# Patient Record
Sex: Female | Born: 1950 | Race: White | Hispanic: No | Marital: Single | Smoking: Former smoker
Health system: Southern US, Community
[De-identification: ages and names within clinical notes are randomized; demographics above are authoritative.]

## PROBLEM LIST (undated history)

## (undated) DIAGNOSIS — K635 Polyp of colon: Secondary | ICD-10-CM

## (undated) DIAGNOSIS — I10 Essential (primary) hypertension: Secondary | ICD-10-CM

## (undated) DIAGNOSIS — G4700 Insomnia, unspecified: Secondary | ICD-10-CM

## (undated) DIAGNOSIS — F32A Depression, unspecified: Secondary | ICD-10-CM

## (undated) DIAGNOSIS — F329 Major depressive disorder, single episode, unspecified: Secondary | ICD-10-CM

## (undated) DIAGNOSIS — M5416 Radiculopathy, lumbar region: Secondary | ICD-10-CM

## (undated) DIAGNOSIS — G2581 Restless legs syndrome: Secondary | ICD-10-CM

## (undated) DIAGNOSIS — K2270 Barrett's esophagus without dysplasia: Secondary | ICD-10-CM

## (undated) DIAGNOSIS — N3941 Urge incontinence: Secondary | ICD-10-CM

## (undated) DIAGNOSIS — M1990 Unspecified osteoarthritis, unspecified site: Secondary | ICD-10-CM

## (undated) DIAGNOSIS — K219 Gastro-esophageal reflux disease without esophagitis: Secondary | ICD-10-CM

## (undated) DIAGNOSIS — I340 Nonrheumatic mitral (valve) insufficiency: Secondary | ICD-10-CM

## (undated) DIAGNOSIS — K529 Noninfective gastroenteritis and colitis, unspecified: Secondary | ICD-10-CM

## (undated) DIAGNOSIS — E785 Hyperlipidemia, unspecified: Secondary | ICD-10-CM

## (undated) DIAGNOSIS — E119 Type 2 diabetes mellitus without complications: Secondary | ICD-10-CM

## (undated) DIAGNOSIS — F419 Anxiety disorder, unspecified: Secondary | ICD-10-CM

## (undated) HISTORY — PX: HERNIA REPAIR: SHX51

## (undated) HISTORY — PX: VAGINAL HYSTERECTOMY: SUR661

## (undated) HISTORY — PX: ABDOMINAL HYSTERECTOMY: SHX81

## (undated) HISTORY — PX: INCONTINENCE SURGERY: SHX676

## (undated) HISTORY — DX: Restless legs syndrome: G25.81

## (undated) HISTORY — DX: Type 2 diabetes mellitus without complications: E11.9

## (undated) HISTORY — DX: Insomnia, unspecified: G47.00

## (undated) HISTORY — DX: Nonrheumatic mitral (valve) insufficiency: I34.0

## (undated) HISTORY — DX: Essential (primary) hypertension: I10

## (undated) HISTORY — PX: VAGINAL PROLAPSE REPAIR: SHX830

## (undated) HISTORY — DX: Gastro-esophageal reflux disease without esophagitis: K21.9

## (undated) HISTORY — PX: CARPAL TUNNEL RELEASE: SHX101

## (undated) HISTORY — DX: Noninfective gastroenteritis and colitis, unspecified: K52.9

## (undated) HISTORY — DX: Unspecified osteoarthritis, unspecified site: M19.90

## (undated) HISTORY — PX: OOPHORECTOMY: SHX86

## (undated) HISTORY — DX: Polyp of colon: K63.5

## (undated) HISTORY — PX: OTHER SURGICAL HISTORY: SHX169

## (undated) HISTORY — PX: TONSILLECTOMY: SUR1361

## (undated) HISTORY — PX: CHOLECYSTECTOMY: SHX55

## (undated) HISTORY — DX: Urge incontinence: N39.41

---
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
## 1997-10-20 ENCOUNTER — Other Ambulatory Visit: Admission: RE | Admit: 1997-10-20 | Discharge: 1997-10-20 | Payer: Self-pay | Admitting: *Deleted

## 1998-04-05 ENCOUNTER — Ambulatory Visit (HOSPITAL_COMMUNITY): Admission: RE | Admit: 1998-04-05 | Discharge: 1998-04-05 | Payer: Self-pay | Admitting: Gastroenterology

## 1998-04-12 ENCOUNTER — Other Ambulatory Visit: Admission: RE | Admit: 1998-04-12 | Discharge: 1998-04-12 | Payer: Self-pay | Admitting: *Deleted

## 1999-01-07 ENCOUNTER — Ambulatory Visit (HOSPITAL_COMMUNITY): Admission: RE | Admit: 1999-01-07 | Discharge: 1999-01-07 | Payer: Self-pay | Admitting: Interventional Cardiology

## 1999-04-27 ENCOUNTER — Other Ambulatory Visit: Admission: RE | Admit: 1999-04-27 | Discharge: 1999-04-27 | Payer: Self-pay | Admitting: *Deleted

## 2000-09-05 ENCOUNTER — Other Ambulatory Visit: Admission: RE | Admit: 2000-09-05 | Discharge: 2000-09-05 | Payer: Self-pay | Admitting: Obstetrics and Gynecology

## 2006-10-30 ENCOUNTER — Ambulatory Visit: Payer: Self-pay | Admitting: Gastroenterology

## 2006-11-06 ENCOUNTER — Ambulatory Visit: Payer: Self-pay | Admitting: Gastroenterology

## 2012-12-26 ENCOUNTER — Emergency Department: Payer: Self-pay | Admitting: Emergency Medicine

## 2013-02-06 ENCOUNTER — Ambulatory Visit: Payer: Self-pay | Admitting: Gastroenterology

## 2013-02-06 ENCOUNTER — Ambulatory Visit: Payer: Self-pay

## 2013-02-06 DIAGNOSIS — I10 Essential (primary) hypertension: Secondary | ICD-10-CM

## 2013-03-13 ENCOUNTER — Ambulatory Visit: Payer: Self-pay | Admitting: Internal Medicine

## 2013-09-17 ENCOUNTER — Ambulatory Visit
Admission: RE | Admit: 2013-09-17 | Discharge: 2013-09-17 | Disposition: A | Discharge status: Home / Self Care | Payer: BC Managed Care – PPO | Source: Ambulatory Visit | Attending: Physician Assistant | Admitting: Physician Assistant

## 2013-09-17 ENCOUNTER — Other Ambulatory Visit: Payer: Self-pay | Admitting: Physician Assistant

## 2013-09-17 DIAGNOSIS — M25552 Pain in left hip: Secondary | ICD-10-CM

## 2013-09-17 DIAGNOSIS — M25562 Pain in left knee: Principal | ICD-10-CM

## 2013-09-17 DIAGNOSIS — M25561 Pain in right knee: Secondary | ICD-10-CM

## 2013-09-17 DIAGNOSIS — M25551 Pain in right hip: Secondary | ICD-10-CM

## 2014-09-23 ENCOUNTER — Other Ambulatory Visit: Payer: Self-pay | Admitting: Orthopedic Surgery

## 2014-09-23 DIAGNOSIS — M5441 Lumbago with sciatica, right side: Secondary | ICD-10-CM

## 2014-10-01 ENCOUNTER — Ambulatory Visit
Admission: RE | Admit: 2014-10-01 | Discharge: 2014-10-01 | Disposition: A | Discharge status: Home / Self Care | Payer: No Typology Code available for payment source | Source: Ambulatory Visit | Attending: Orthopedic Surgery | Admitting: Orthopedic Surgery

## 2014-10-01 DIAGNOSIS — M4806 Spinal stenosis, lumbar region: Secondary | ICD-10-CM | POA: Insufficient documentation

## 2014-10-01 DIAGNOSIS — M5441 Lumbago with sciatica, right side: Secondary | ICD-10-CM | POA: Diagnosis present

## 2014-10-01 DIAGNOSIS — M5440 Lumbago with sciatica, unspecified side: Secondary | ICD-10-CM | POA: Diagnosis present

## 2014-10-01 DIAGNOSIS — M5126 Other intervertebral disc displacement, lumbar region: Secondary | ICD-10-CM | POA: Insufficient documentation

## 2015-02-08 ENCOUNTER — Other Ambulatory Visit: Payer: Self-pay | Admitting: Family Medicine

## 2015-02-08 DIAGNOSIS — Z1231 Encounter for screening mammogram for malignant neoplasm of breast: Secondary | ICD-10-CM

## 2015-02-09 ENCOUNTER — Ambulatory Visit
Admission: RE | Admit: 2015-02-09 | Discharge: 2015-02-09 | Disposition: A | Discharge status: Home / Self Care | Payer: No Typology Code available for payment source | Source: Ambulatory Visit | Attending: Family Medicine | Admitting: Family Medicine

## 2015-02-09 DIAGNOSIS — Z1231 Encounter for screening mammogram for malignant neoplasm of breast: Secondary | ICD-10-CM | POA: Insufficient documentation

## 2016-03-08 ENCOUNTER — Other Ambulatory Visit: Payer: Self-pay | Admitting: Family Medicine

## 2016-03-08 DIAGNOSIS — Z1231 Encounter for screening mammogram for malignant neoplasm of breast: Secondary | ICD-10-CM

## 2016-04-12 ENCOUNTER — Ambulatory Visit
Admission: RE | Admit: 2016-04-12 | Discharge: 2016-04-12 | Disposition: A | Discharge status: Home / Self Care | Payer: BLUE CROSS/BLUE SHIELD | Source: Ambulatory Visit | Attending: Family Medicine | Admitting: Family Medicine

## 2016-04-12 DIAGNOSIS — Z1231 Encounter for screening mammogram for malignant neoplasm of breast: Secondary | ICD-10-CM | POA: Diagnosis not present

## 2016-09-04 ENCOUNTER — Ambulatory Visit (INDEPENDENT_AMBULATORY_CARE_PROVIDER_SITE_OTHER): Payer: BLUE CROSS/BLUE SHIELD | Admitting: Podiatry

## 2016-09-04 ENCOUNTER — Encounter: Payer: Self-pay | Admitting: Podiatry

## 2016-09-04 DIAGNOSIS — M79676 Pain in unspecified toe(s): Secondary | ICD-10-CM | POA: Diagnosis not present

## 2016-09-04 DIAGNOSIS — L600 Ingrowing nail: Secondary | ICD-10-CM

## 2016-09-04 DIAGNOSIS — L03039 Cellulitis of unspecified toe: Secondary | ICD-10-CM

## 2016-09-04 MED ORDER — DOXYCYCLINE HYCLATE 100 MG PO TABS: 100.0000 mg | ORAL_TABLET | Freq: Two times a day (BID) | ORAL | 0 refills | Status: DC

## 2016-09-04 NOTE — Patient Instructions (Signed)

## 2016-09-05 NOTE — Progress Notes (Signed)
   Subjective: Patient with PMHx of T2DM presents today for evaluation of pain to the medial border of the left great toe that has been ongoing for several years. Patient is concerned for possible ingrown nail. Patient presents today for further treatment and evaluation.  Objective:  General: Well developed, nourished, in no acute distress, alert and oriented x3   Dermatology: Skin is warm, dry and supple bilateral. Medial border of the left great toe appears to be erythematous with evidence of an ingrowing nail. Pain on palpation noted to the border of the nail fold. The remaining nails appear unremarkable at this time. There are no open sores, lesions.  Vascular: Dorsalis Pedis artery and Posterior Tibial artery pedal pulses palpable. No lower extremity edema noted.   Neruologic: Grossly intact via light touch bilateral.  Musculoskeletal: Muscular strength within normal limits in all groups bilateral. Normal range of motion noted to all pedal and ankle joints.   Assesement: #1 Paronychia with ingrowing nail medial left great toe #2 Pain in toe #3 Incurvated nail  Plan of Care:  1. Patient evaluated.  2. Discussed treatment alternatives and plan of care. Explained nail avulsion procedure and post procedure course to patient. 3. Patient opted for permanent partial nail avulsion.  4. Prior to procedure, local anesthesia infiltration utilized using 3 ml of a 50:50 mixture of 2% plain lidocaine and 0.5% plain marcaine in a normal hallux block fashion and a betadine prep performed.  5. Partial permanent nail avulsion with chemical matrixectomy performed using [id number] applications of phenol followed by alcohol flush.  6. Light dressing applied. 7. Prescription for Doxycycline 100 mg #14 given. 8. Return to clinic in 2 weeks.   Edrick Kins, DPM [facility]  Dr. Edrick Kins, [address]                                        [place], [place] [address]                 Office [phone]  Fax [phone]

## 2016-09-18 ENCOUNTER — Ambulatory Visit (INDEPENDENT_AMBULATORY_CARE_PROVIDER_SITE_OTHER): Payer: BLUE CROSS/BLUE SHIELD | Admitting: Podiatry

## 2016-09-18 ENCOUNTER — Encounter: Payer: Self-pay | Admitting: Podiatry

## 2016-09-18 DIAGNOSIS — S91109D Unspecified open wound of unspecified toe(s) without damage to nail, subsequent encounter: Secondary | ICD-10-CM

## 2016-09-18 DIAGNOSIS — M79676 Pain in unspecified toe(s): Secondary | ICD-10-CM

## 2016-09-18 DIAGNOSIS — S91209D Unspecified open wound of unspecified toe(s) with damage to nail, subsequent encounter: Secondary | ICD-10-CM

## 2016-09-20 NOTE — Progress Notes (Signed)
   Subjective: Patient presents today 2 weeks post ingrown nail permanent nail avulsion procedure. Patient states that the toe and nail fold is feeling much better.  Objective: Skin is warm, dry and supple. Nail and respective nail fold appears to be healing appropriately. Open wound to the associated nail fold with a granular wound base and moderate amount of fibrotic tissue. Minimal drainage noted. Mild erythema around the periungual region likely due to phenol chemical matricectomy.  Assessment: #1 postop permanent partial nail avulsion left great toe medial border #2 open wound periungual nail fold of respective digit.   Plan of care: #1 patient was evaluated  #2 debridement of open wound was performed to the periungual border of the respective toe using a currette. Antibiotic ointment and Band-Aid was applied. #3 patient is to return to clinic on a PRN  basis.   Brent M. Evans, DPM [facility]  Dr. Brent M. Evans, DPM    [address]                                        [place], [place] [address]                Office [phone]  Fax [phone]      

## 2017-10-15 ENCOUNTER — Other Ambulatory Visit: Payer: Self-pay | Admitting: Family Medicine

## 2017-10-15 DIAGNOSIS — Z1231 Encounter for screening mammogram for malignant neoplasm of breast: Secondary | ICD-10-CM

## 2017-10-26 ENCOUNTER — Ambulatory Visit
Admission: RE | Admit: 2017-10-26 | Discharge: 2017-10-26 | Disposition: A | Discharge status: Home / Self Care | Payer: Medicare Other | Source: Ambulatory Visit | Attending: Family Medicine | Admitting: Family Medicine

## 2017-10-26 DIAGNOSIS — Z1231 Encounter for screening mammogram for malignant neoplasm of breast: Secondary | ICD-10-CM | POA: Insufficient documentation

## 2017-10-29 ENCOUNTER — Other Ambulatory Visit: Payer: Self-pay | Admitting: Family Medicine

## 2017-10-29 DIAGNOSIS — R928 Other abnormal and inconclusive findings on diagnostic imaging of breast: Secondary | ICD-10-CM

## 2017-10-29 DIAGNOSIS — N6489 Other specified disorders of breast: Secondary | ICD-10-CM

## 2017-11-05 ENCOUNTER — Ambulatory Visit: Payer: No Typology Code available for payment source

## 2017-11-05 ENCOUNTER — Other Ambulatory Visit: Payer: No Typology Code available for payment source

## 2017-11-08 ENCOUNTER — Ambulatory Visit
Admission: RE | Admit: 2017-11-08 | Discharge: 2017-11-08 | Disposition: A | Discharge status: Home / Self Care | Payer: Medicare Other | Source: Ambulatory Visit | Attending: Family Medicine | Admitting: Family Medicine

## 2017-11-08 DIAGNOSIS — R928 Other abnormal and inconclusive findings on diagnostic imaging of breast: Secondary | ICD-10-CM | POA: Insufficient documentation

## 2017-11-08 DIAGNOSIS — N6489 Other specified disorders of breast: Secondary | ICD-10-CM | POA: Insufficient documentation

## 2018-01-21 ENCOUNTER — Other Ambulatory Visit: Payer: Self-pay | Admitting: Physician Assistant

## 2018-01-21 DIAGNOSIS — Z13820 Encounter for screening for osteoporosis: Secondary | ICD-10-CM

## 2018-02-27 ENCOUNTER — Ambulatory Visit
Admission: RE | Admit: 2018-02-27 | Discharge: 2018-02-27 | Disposition: A | Discharge status: Home / Self Care | Payer: Medicare Other | Source: Ambulatory Visit | Attending: Physician Assistant | Admitting: Physician Assistant

## 2018-02-27 DIAGNOSIS — Z780 Asymptomatic menopausal state: Secondary | ICD-10-CM | POA: Insufficient documentation

## 2018-02-27 DIAGNOSIS — E119 Type 2 diabetes mellitus without complications: Secondary | ICD-10-CM | POA: Insufficient documentation

## 2018-02-27 DIAGNOSIS — Z13820 Encounter for screening for osteoporosis: Secondary | ICD-10-CM | POA: Diagnosis present

## 2019-01-02 ENCOUNTER — Other Ambulatory Visit: Payer: Self-pay | Admitting: Family

## 2019-01-02 DIAGNOSIS — Z1231 Encounter for screening mammogram for malignant neoplasm of breast: Secondary | ICD-10-CM

## 2019-03-27 ENCOUNTER — Ambulatory Visit
Admission: RE | Admit: 2019-03-27 | Discharge: 2019-03-27 | Disposition: A | Discharge status: Home / Self Care | Payer: Medicare Other | Source: Ambulatory Visit | Attending: Family | Admitting: Family

## 2019-03-27 DIAGNOSIS — Z1231 Encounter for screening mammogram for malignant neoplasm of breast: Secondary | ICD-10-CM | POA: Diagnosis not present

## 2019-05-13 ENCOUNTER — Ambulatory Visit: Payer: Medicare Other | Attending: Internal Medicine

## 2019-05-13 DIAGNOSIS — Z20822 Contact with and (suspected) exposure to covid-19: Secondary | ICD-10-CM

## 2019-05-14 LAB — NOVEL CORONAVIRUS, NAA: SARS-CoV-2, NAA: DETECTED — AB

## 2019-05-15 ENCOUNTER — Encounter: Payer: Self-pay | Admitting: *Deleted

## 2019-08-04 ENCOUNTER — Other Ambulatory Visit: Admission: RE | Admit: 2019-08-04 | Payer: Medicare Other | Source: Ambulatory Visit

## 2019-08-05 ENCOUNTER — Encounter: Payer: Self-pay | Admitting: Internal Medicine

## 2019-08-06 ENCOUNTER — Encounter: Payer: Self-pay | Admitting: Internal Medicine

## 2019-08-06 ENCOUNTER — Ambulatory Visit: Payer: Medicare Other | Admitting: Anesthesiology

## 2019-08-06 ENCOUNTER — Encounter
Admission: RE | Disposition: A | Discharge status: Home / Self Care | Payer: Self-pay | Source: Home / Self Care | Attending: Internal Medicine

## 2019-08-06 ENCOUNTER — Ambulatory Visit
Admission: RE | Admit: 2019-08-06 | Discharge: 2019-08-06 | Disposition: A | Discharge status: Home / Self Care | Payer: Medicare Other | Attending: Internal Medicine | Admitting: Internal Medicine

## 2019-08-06 ENCOUNTER — Other Ambulatory Visit: Payer: Self-pay

## 2019-08-06 DIAGNOSIS — Z79899 Other long term (current) drug therapy: Secondary | ICD-10-CM | POA: Insufficient documentation

## 2019-08-06 DIAGNOSIS — F329 Major depressive disorder, single episode, unspecified: Secondary | ICD-10-CM | POA: Diagnosis not present

## 2019-08-06 DIAGNOSIS — G2581 Restless legs syndrome: Secondary | ICD-10-CM | POA: Insufficient documentation

## 2019-08-06 DIAGNOSIS — M5416 Radiculopathy, lumbar region: Secondary | ICD-10-CM | POA: Diagnosis not present

## 2019-08-06 DIAGNOSIS — Z87891 Personal history of nicotine dependence: Secondary | ICD-10-CM | POA: Insufficient documentation

## 2019-08-06 DIAGNOSIS — K2270 Barrett's esophagus without dysplasia: Secondary | ICD-10-CM | POA: Diagnosis not present

## 2019-08-06 DIAGNOSIS — Z7984 Long term (current) use of oral hypoglycemic drugs: Secondary | ICD-10-CM | POA: Diagnosis not present

## 2019-08-06 DIAGNOSIS — Z885 Allergy status to narcotic agent status: Secondary | ICD-10-CM | POA: Diagnosis not present

## 2019-08-06 DIAGNOSIS — Z90710 Acquired absence of both cervix and uterus: Secondary | ICD-10-CM | POA: Insufficient documentation

## 2019-08-06 DIAGNOSIS — Z791 Long term (current) use of non-steroidal anti-inflammatories (NSAID): Secondary | ICD-10-CM | POA: Diagnosis not present

## 2019-08-06 DIAGNOSIS — I10 Essential (primary) hypertension: Secondary | ICD-10-CM | POA: Insufficient documentation

## 2019-08-06 DIAGNOSIS — E119 Type 2 diabetes mellitus without complications: Secondary | ICD-10-CM | POA: Insufficient documentation

## 2019-08-06 DIAGNOSIS — E785 Hyperlipidemia, unspecified: Secondary | ICD-10-CM | POA: Insufficient documentation

## 2019-08-06 DIAGNOSIS — F419 Anxiety disorder, unspecified: Secondary | ICD-10-CM | POA: Diagnosis not present

## 2019-08-06 HISTORY — DX: Restless legs syndrome: G25.81

## 2019-08-06 HISTORY — PX: ESOPHAGOGASTRODUODENOSCOPY (EGD) WITH PROPOFOL: SHX5813

## 2019-08-06 HISTORY — DX: Anxiety disorder, unspecified: F41.9

## 2019-08-06 HISTORY — DX: Radiculopathy, lumbar region: M54.16

## 2019-08-06 HISTORY — DX: Barrett's esophagus without dysplasia: K22.70

## 2019-08-06 HISTORY — DX: Hyperlipidemia, unspecified: E78.5

## 2019-08-06 HISTORY — DX: Depression, unspecified: F32.A

## 2019-08-06 LAB — GLUCOSE, CAPILLARY: Glucose-Capillary: 166 mg/dL — ABNORMAL HIGH (ref 70–99)

## 2019-08-06 SURGERY — ESOPHAGOGASTRODUODENOSCOPY (EGD) WITH PROPOFOL
Anesthesia: General

## 2019-08-06 MED ORDER — PROPOFOL 10 MG/ML IV BOLUS
INTRAVENOUS | Status: AC
  Filled 2019-08-06: qty 40

## 2019-08-06 MED ORDER — SODIUM CHLORIDE 0.9 % IV SOLN: INTRAVENOUS | Status: DC

## 2019-08-06 MED ORDER — PROPOFOL 500 MG/50ML IV EMUL
INTRAVENOUS | Status: DC | PRN
  Administered 2019-08-06: 125 ug/kg/min via INTRAVENOUS

## 2019-08-06 MED ORDER — LIDOCAINE HCL (CARDIAC) PF 100 MG/5ML IV SOSY
PREFILLED_SYRINGE | INTRAVENOUS | Status: DC | PRN
  Administered 2019-08-06: 40 mg via INTRAVENOUS

## 2019-08-06 NOTE — H&P (Signed)
Outpatient short stay form Pre-procedure 08/06/2019 10:03 AM Jacqueline Alexander K. Alice Reichert, M.D.  Primary Physician: August Luz, PA  Reason for visit:  Barrett's esophagus  History of present illness: Patient presents for surveillance of Barrett's esophagus. Besides mild dyspepsia occasionally, patient Patient denies intractable heartburn, dysphagia, hemetemesis, abdominal pain, nausea or vomiting.     Current Facility-Administered Medications:  .  0.9 %  sodium chloride infusion, , Intravenous, Continuous, [place], Benay Pike, MD, Last Rate: 20 mL/hr at 08/06/19 0919, New Bag at 08/06/19 0919  Medications Prior to Admission  Medication Sig Dispense Refill Last Dose  . amLODipine (NORVASC) 5 MG tablet Take 5 mg by mouth daily.   08/06/2019 at Unknown time  . carvedilol (COREG) 25 MG tablet Take by mouth.   08/06/2019 at Unknown time  . celecoxib (CELEBREX) 100 MG capsule Take 100 mg by mouth 2 (two) times daily.   Past Week at Unknown time  . cholecalciferol (VITAMIN D3) 10 MCG (400 UNIT) TABS tablet Take 1,000 Units by mouth.   08/04/2019  . FLUoxetine (PROZAC) 40 MG capsule Take 40 mg by mouth daily.   08/05/2019 at Unknown time  . LORazepam (ATIVAN) 0.5 MG tablet TK 1 T PO ONCE DAILY PRN  0 Past Week at Unknown time  . LORazepam (ATIVAN) 0.5 MG tablet Take 0.5 mg by mouth every 8 (eight) hours.   Past Week at Unknown time  . metFORMIN (GLUCOPHAGE) 500 MG tablet Take 500 mg by mouth.   08/05/2019 at Unknown time  . pantoprazole (PROTONIX) 20 MG tablet Take 20 mg by mouth.   08/05/2019 at Unknown time  . rosuvastatin (CRESTOR) 40 MG tablet Take 40 mg by mouth daily.   08/05/2019 at Unknown time  . sitaGLIPtin (JANUVIA) 100 MG tablet Take 100 mg by mouth daily.   08/05/2019 at Unknown time  . vitamin B-12 (CYANOCOBALAMIN) 250 MCG tablet Take 2,500 mcg by mouth daily.   08/05/2019 at Unknown time  . Vitamin D, Ergocalciferol, (DRISDOL) 50000 units CAPS capsule TK 1 C PO WEEKLY  3 08/05/2019 at Unknown time  .  ASSURE COMFORT LANCETS 30G MISC   2   . BAYER CONTOUR TEST test strip   2   . cetirizine (ZYRTEC) 10 MG tablet Take 10 mg by mouth daily.   Not Taking at Unknown time  . Desvenlafaxine Succinate (PRISTIQ PO) Take by mouth.   Not Taking at Unknown time  . doxycycline (VIBRA-TABS) 100 MG tablet Take 1 tablet (100 mg total) by mouth 2 (two) times daily. (Patient not taking: Reported on 08/06/2019) 14 tablet 0 Not Taking at Unknown time  . polyethylene glycol (MIRALAX / GLYCOLAX) packet Take by mouth.        Allergies  Allergen Reactions  . Morphine Nausea And Vomiting     Past Medical History:  Diagnosis Date  . Anxiety   . Barrett esophagus   . Depression   . Diabetes mellitus without complication ([place])   . Hyperlipemia   . Hypertension   . Lumbar radiculitis   . Restless leg     Review of systems:  Otherwise negative.    Physical Exam  Gen: Alert, oriented. Appears stated age.  HEENT: /AT. PERRLA. Lungs: CTA, no wheezes. CV: RR nl S1, S2. Abd: soft, benign, no masses. BS+ Ext: No edema. Pulses 2+    Planned procedures: Proceed with EGD. The patient understands the nature of the planned procedure, indications, risks, alternatives and potential complications including but not limited to bleeding, infection, perforation,  damage to internal organs and possible oversedation/side effects from anesthesia. The patient agrees and gives consent to proceed.  Please refer to procedure notes for findings, recommendations and patient disposition/instructions.     Devonta Blanford K. Alice Reichert, M.D. Gastroenterology 08/06/2019  10:03 AM

## 2019-08-06 NOTE — Transfer of Care (Signed)
Immediate Anesthesia Transfer of Care Note  Patient: Jacqueline Alexander  Procedure(s) Performed: ESOPHAGOGASTRODUODENOSCOPY (EGD) WITH PROPOFOL (N/A )  Patient Location: PACU  Anesthesia Type:MAC  Level of Consciousness: awake and alert   Airway & Oxygen Therapy: Patient Spontanous Breathing  Post-op Assessment: Report given to RN  Post vital signs: stable  Last Vitals:  Vitals Value Taken Time  BP    Temp    Pulse 73 08/06/19 1016  Resp 13 08/06/19 1016  SpO2 92 % 08/06/19 1016  Vitals shown include unvalidated device data.  Last Pain:  Vitals:   08/06/19 0858  TempSrc: Temporal         Complications: No apparent anesthesia complications

## 2019-08-06 NOTE — Anesthesia Postprocedure Evaluation (Signed)
Anesthesia Post Note  Patient: Jacqueline Alexander  Procedure(s) Performed: ESOPHAGOGASTRODUODENOSCOPY (EGD) WITH PROPOFOL (N/A )  Patient location during evaluation: PACU Anesthesia Type: General Level of consciousness: awake and alert Pain management: pain level controlled Vital Signs Assessment: post-procedure vital signs reviewed and stable Respiratory status: spontaneous breathing, nonlabored ventilation and respiratory function stable Cardiovascular status: blood pressure returned to baseline and stable Postop Assessment: no apparent nausea or vomiting Anesthetic complications: no     Last Vitals:  Vitals:   08/06/19 1036 08/06/19 1038  BP: 113/73   Pulse: 76 71  Resp: 12 14  Temp:    SpO2: 97% 95%    Last Pain:  Vitals:   08/06/19 1039  TempSrc:   PainSc: 0-No pain                 Tera Mater

## 2019-08-06 NOTE — Interval H&P Note (Signed)
History and Physical Interval Note:  08/06/2019 10:04 AM  Jacqueline Alexander  has presented today for surgery, with the diagnosis of BARRETT'S ESOPHAGUS.  The various methods of treatment have been discussed with the patient and family. After consideration of risks, benefits and other options for treatment, the patient has consented to  Procedure(s): ESOPHAGOGASTRODUODENOSCOPY (EGD) WITH PROPOFOL (N/A) as a surgical intervention.  The patient's history has been reviewed, patient examined, no change in status, stable for surgery.  I have reviewed the patient's chart and labs.  Questions were answered to the patient's satisfaction.     [place], [place]

## 2019-08-06 NOTE — Op Note (Signed)
[facility] Gastroenterology Patient Name: Jacqueline Alexander Procedure Date: 08/06/2019 10:03 AM MRN: [id number] Account #: [id number] Date of Birth: 08-15-1950 Admit Type: Outpatient Age: 69 Room: [facility] ENDO ROOM 3 Gender: Female Note Status: Finalized Procedure:             Upper GI endoscopy Indications:           Surveillance for malignancy due to personal history of                         Barrett's esophagus Providers:             Benay Pike. Alice Reichert MD, MD Referring MD:          No Local Md, MD (Referring MD) Medicines:             Propofol per Anesthesia Complications:         No immediate complications. Procedure:             Pre-Anesthesia Assessment:                        - The risks and benefits of the procedure and the                         sedation options and risks were discussed with the                         patient. All questions were answered and informed                         consent was obtained.                        - Patient identification and proposed procedure were                         verified prior to the procedure by the nurse. The                         procedure was verified in the procedure room.                        - ASA Grade Assessment: III - A patient with severe                         systemic disease.                        After obtaining informed consent, the endoscope was                         passed under direct vision. Throughout the procedure,                         the patient's blood pressure, pulse, and oxygen                         saturations were monitored continuously. The Endoscope                         was  introduced through the mouth, and advanced to the                         third part of duodenum. The upper GI endoscopy was                         accomplished without difficulty. The patient tolerated                         the procedure well. Findings:      There were esophageal mucosal  changes secondary to established       short-segment Barrett's disease present in the distal esophagus. The       maximum longitudinal extent of these mucosal changes was 2 cm in length.       Mucosa was biopsied with a cold forceps for histology in a targeted       manner at intervals of 1 cm 2 cm proximal to the GE junction. One       specimen bottle was sent to pathology.      The stomach was normal.      The examined duodenum was normal.      The exam was otherwise without abnormality. Impression:            - Esophageal mucosal changes secondary to established                         short-segment Barrett's disease. Biopsied.                        - Normal stomach.                        - Normal examined duodenum.                        - The examination was otherwise normal. Recommendation:        - Await pathology results.                        - Patient has a contact number available for                         emergencies. The signs and symptoms of potential                         delayed complications were discussed with the patient.                         Return to normal activities tomorrow. Written                         discharge instructions were provided to the patient.                        - Resume previous diet.                        - Continue present medications.                        - Repeat  upper endoscopy after studies are complete                         for surveillance.                        - Return to GI office in 1 year.                        - The findings and recommendations were discussed with                         the patient. Procedure Code(s):     --- Professional ---                        [phone], Esophagogastroduodenoscopy, flexible,                         transoral; with biopsy, single or multiple Diagnosis Code(s):     --- Professional ---                        K22.70, Barrett's esophagus without dysplasia CPT copyright 2019  American Medical Association. All rights reserved. The codes documented in this report are preliminary and upon coder review may  be revised to meet current compliance requirements. Efrain Sella MD, MD 08/06/2019 10:15:40 AM This report has been signed electronically. Number of Addenda: 0 Note Initiated On: 08/06/2019 10:03 AM Estimated Blood Loss:  Estimated blood loss: none.      [facility]

## 2019-08-06 NOTE — Anesthesia Preprocedure Evaluation (Addendum)
Anesthesia Evaluation  Patient identified by MRN, date of birth, ID band Patient awake    Reviewed: Allergy & Precautions, H&P , NPO status , Patient's Chart, lab work & pertinent test results  Airway Mallampati: II  TM Distance: >3 FB     Dental  (+) Edentulous Upper, Edentulous Lower   Pulmonary neg COPD, former smoker,           Cardiovascular hypertension, (-) angina     Neuro/Psych PSYCHIATRIC DISORDERS Anxiety Depression negative neurological ROS     GI/Hepatic negative GI ROS, Neg liver ROS,   Endo/Other  diabetes  Renal/GU negative Renal ROS  negative genitourinary   Musculoskeletal   Abdominal   Peds  Hematology negative hematology ROS (+)   Anesthesia Other Findings Past Medical History: No date: Anxiety No date: Barrett esophagus No date: Depression No date: Diabetes mellitus without complication ([facility]) No date: Hyperlipemia No date: Hypertension No date: Lumbar radiculitis No date: Restless leg  Past Surgical History: No date: ABDOMINAL HYSTERECTOMY No date: CARPAL TUNNEL RELEASE; Left No date: CHOLECYSTECTOMY No date: HERNIA REPAIR No date: OOPHORECTOMY  BMI    Body Mass Index: 26.96 kg/m      Reproductive/Obstetrics negative OB ROS                            Anesthesia Physical Anesthesia Plan  ASA: II  Anesthesia Plan: General   Post-op Pain Management:    Induction:   PONV Risk Score and Plan: Propofol infusion and TIVA  Airway Management Planned: Natural Airway and Nasal Cannula  Additional Equipment:   Intra-op Plan:   Post-operative Plan:   Informed Consent: I have reviewed the patients History and Physical, chart, labs and discussed the procedure including the risks, benefits and alternatives for the proposed anesthesia with the patient or authorized representative who has indicated his/her understanding and acceptance.     Dental Advisory  Given  Plan Discussed with: Anesthesiologist  Anesthesia Plan Comments:         Anesthesia Quick Evaluation

## 2019-08-07 ENCOUNTER — Encounter: Payer: Self-pay | Admitting: *Deleted

## 2019-08-07 LAB — SURGICAL PATHOLOGY

## 2020-01-12 ENCOUNTER — Other Ambulatory Visit: Payer: Self-pay | Admitting: Family

## 2020-01-12 DIAGNOSIS — Z13820 Encounter for screening for osteoporosis: Secondary | ICD-10-CM

## 2020-02-27 ENCOUNTER — Other Ambulatory Visit: Payer: Self-pay | Admitting: Family

## 2020-02-27 DIAGNOSIS — Z1231 Encounter for screening mammogram for malignant neoplasm of breast: Secondary | ICD-10-CM

## 2020-03-01 ENCOUNTER — Ambulatory Visit
Admission: RE | Admit: 2020-03-01 | Discharge: 2020-03-01 | Disposition: A | Discharge status: Home / Self Care | Payer: Medicare Other | Source: Ambulatory Visit | Attending: Family | Admitting: Family

## 2020-03-01 ENCOUNTER — Other Ambulatory Visit: Payer: Self-pay

## 2020-03-01 DIAGNOSIS — Z780 Asymptomatic menopausal state: Secondary | ICD-10-CM | POA: Insufficient documentation

## 2020-03-01 DIAGNOSIS — Z13820 Encounter for screening for osteoporosis: Secondary | ICD-10-CM | POA: Diagnosis not present

## 2020-04-05 ENCOUNTER — Ambulatory Visit
Admission: RE | Admit: 2020-04-05 | Discharge: 2020-04-05 | Disposition: A | Discharge status: Home / Self Care | Payer: Medicare Other | Source: Ambulatory Visit | Attending: Family | Admitting: Family

## 2020-04-05 ENCOUNTER — Other Ambulatory Visit: Payer: Self-pay

## 2020-04-05 DIAGNOSIS — Z1231 Encounter for screening mammogram for malignant neoplasm of breast: Secondary | ICD-10-CM | POA: Insufficient documentation

## 2020-05-21 DIAGNOSIS — L900 Lichen sclerosus et atrophicus: Secondary | ICD-10-CM | POA: Diagnosis not present

## 2020-06-07 DIAGNOSIS — M67432 Ganglion, left wrist: Secondary | ICD-10-CM | POA: Diagnosis not present

## 2020-06-10 DIAGNOSIS — M65331 Trigger finger, right middle finger: Secondary | ICD-10-CM | POA: Diagnosis not present

## 2020-06-29 DIAGNOSIS — L900 Lichen sclerosus et atrophicus: Secondary | ICD-10-CM | POA: Diagnosis not present

## 2020-06-30 DIAGNOSIS — D509 Iron deficiency anemia, unspecified: Secondary | ICD-10-CM | POA: Diagnosis not present

## 2020-06-30 DIAGNOSIS — E785 Hyperlipidemia, unspecified: Secondary | ICD-10-CM | POA: Diagnosis not present

## 2020-06-30 DIAGNOSIS — I10 Essential (primary) hypertension: Secondary | ICD-10-CM | POA: Diagnosis not present

## 2020-06-30 DIAGNOSIS — E7800 Pure hypercholesterolemia, unspecified: Secondary | ICD-10-CM | POA: Diagnosis not present

## 2020-06-30 DIAGNOSIS — E1169 Type 2 diabetes mellitus with other specified complication: Secondary | ICD-10-CM | POA: Diagnosis not present

## 2020-06-30 DIAGNOSIS — G2581 Restless legs syndrome: Secondary | ICD-10-CM | POA: Diagnosis not present

## 2020-06-30 DIAGNOSIS — K219 Gastro-esophageal reflux disease without esophagitis: Secondary | ICD-10-CM | POA: Diagnosis not present

## 2020-07-14 DIAGNOSIS — G8918 Other acute postprocedural pain: Secondary | ICD-10-CM | POA: Diagnosis not present

## 2020-07-14 DIAGNOSIS — Z7982 Long term (current) use of aspirin: Secondary | ICD-10-CM | POA: Diagnosis not present

## 2020-07-14 DIAGNOSIS — E119 Type 2 diabetes mellitus without complications: Secondary | ICD-10-CM | POA: Diagnosis not present

## 2020-07-14 DIAGNOSIS — Z87891 Personal history of nicotine dependence: Secondary | ICD-10-CM | POA: Diagnosis not present

## 2020-07-14 DIAGNOSIS — M67432 Ganglion, left wrist: Secondary | ICD-10-CM | POA: Diagnosis not present

## 2020-07-14 DIAGNOSIS — Z7984 Long term (current) use of oral hypoglycemic drugs: Secondary | ICD-10-CM | POA: Diagnosis not present

## 2020-07-14 DIAGNOSIS — Z885 Allergy status to narcotic agent status: Secondary | ICD-10-CM | POA: Diagnosis not present

## 2020-07-14 DIAGNOSIS — I10 Essential (primary) hypertension: Secondary | ICD-10-CM | POA: Diagnosis not present

## 2020-07-14 DIAGNOSIS — M25532 Pain in left wrist: Secondary | ICD-10-CM | POA: Diagnosis not present

## 2020-08-19 DIAGNOSIS — Z9181 History of falling: Secondary | ICD-10-CM | POA: Diagnosis not present

## 2020-08-19 DIAGNOSIS — E785 Hyperlipidemia, unspecified: Secondary | ICD-10-CM | POA: Diagnosis not present

## 2020-08-19 DIAGNOSIS — Z0000 Encounter for general adult medical examination without abnormal findings: Secondary | ICD-10-CM | POA: Diagnosis not present

## 2020-10-28 DIAGNOSIS — I10 Essential (primary) hypertension: Secondary | ICD-10-CM | POA: Diagnosis not present

## 2020-10-28 DIAGNOSIS — E1169 Type 2 diabetes mellitus with other specified complication: Secondary | ICD-10-CM | POA: Diagnosis not present

## 2020-10-28 DIAGNOSIS — E785 Hyperlipidemia, unspecified: Secondary | ICD-10-CM | POA: Diagnosis not present

## 2020-10-28 DIAGNOSIS — F32A Depression, unspecified: Secondary | ICD-10-CM | POA: Diagnosis not present

## 2020-10-28 DIAGNOSIS — E7800 Pure hypercholesterolemia, unspecified: Secondary | ICD-10-CM | POA: Diagnosis not present

## 2020-10-28 DIAGNOSIS — D509 Iron deficiency anemia, unspecified: Secondary | ICD-10-CM | POA: Diagnosis not present

## 2020-11-04 DIAGNOSIS — E785 Hyperlipidemia, unspecified: Secondary | ICD-10-CM | POA: Diagnosis not present

## 2020-11-04 DIAGNOSIS — E1169 Type 2 diabetes mellitus with other specified complication: Secondary | ICD-10-CM | POA: Diagnosis not present

## 2021-03-01 DIAGNOSIS — E785 Hyperlipidemia, unspecified: Secondary | ICD-10-CM | POA: Diagnosis not present

## 2021-03-01 DIAGNOSIS — M5431 Sciatica, right side: Secondary | ICD-10-CM | POA: Diagnosis not present

## 2021-03-01 DIAGNOSIS — I10 Essential (primary) hypertension: Secondary | ICD-10-CM | POA: Diagnosis not present

## 2021-03-01 DIAGNOSIS — D509 Iron deficiency anemia, unspecified: Secondary | ICD-10-CM | POA: Diagnosis not present

## 2021-03-01 DIAGNOSIS — E1169 Type 2 diabetes mellitus with other specified complication: Secondary | ICD-10-CM | POA: Diagnosis not present

## 2021-03-01 DIAGNOSIS — E7800 Pure hypercholesterolemia, unspecified: Secondary | ICD-10-CM | POA: Diagnosis not present

## 2021-03-02 DIAGNOSIS — E119 Type 2 diabetes mellitus without complications: Secondary | ICD-10-CM | POA: Diagnosis not present

## 2021-03-10 DIAGNOSIS — D22112 Melanocytic nevi of right lower eyelid, including canthus: Secondary | ICD-10-CM | POA: Diagnosis not present

## 2021-03-10 DIAGNOSIS — C44111 Basal cell carcinoma of skin of unspecified eyelid, including canthus: Secondary | ICD-10-CM | POA: Diagnosis not present

## 2021-03-15 ENCOUNTER — Other Ambulatory Visit: Payer: Self-pay | Admitting: Family Medicine

## 2021-03-15 DIAGNOSIS — Z1231 Encounter for screening mammogram for malignant neoplasm of breast: Secondary | ICD-10-CM

## 2021-04-06 ENCOUNTER — Other Ambulatory Visit: Payer: Self-pay

## 2021-04-06 ENCOUNTER — Ambulatory Visit
Admission: RE | Admit: 2021-04-06 | Discharge: 2021-04-06 | Disposition: A | Discharge status: Home / Self Care | Payer: Medicare Other | Source: Ambulatory Visit | Attending: Family Medicine | Admitting: Family Medicine

## 2021-04-06 DIAGNOSIS — Z1231 Encounter for screening mammogram for malignant neoplasm of breast: Secondary | ICD-10-CM | POA: Diagnosis not present

## 2021-07-04 DIAGNOSIS — Z23 Encounter for immunization: Secondary | ICD-10-CM | POA: Diagnosis not present

## 2021-07-04 DIAGNOSIS — I10 Essential (primary) hypertension: Secondary | ICD-10-CM | POA: Diagnosis not present

## 2021-07-04 DIAGNOSIS — Z139 Encounter for screening, unspecified: Secondary | ICD-10-CM | POA: Diagnosis not present

## 2021-07-04 DIAGNOSIS — D509 Iron deficiency anemia, unspecified: Secondary | ICD-10-CM | POA: Diagnosis not present

## 2021-07-04 DIAGNOSIS — E7800 Pure hypercholesterolemia, unspecified: Secondary | ICD-10-CM | POA: Diagnosis not present

## 2021-07-04 DIAGNOSIS — E1169 Type 2 diabetes mellitus with other specified complication: Secondary | ICD-10-CM | POA: Diagnosis not present

## 2021-07-04 DIAGNOSIS — E785 Hyperlipidemia, unspecified: Secondary | ICD-10-CM | POA: Diagnosis not present

## 2021-08-24 DIAGNOSIS — J209 Acute bronchitis, unspecified: Secondary | ICD-10-CM | POA: Diagnosis not present

## 2021-08-24 DIAGNOSIS — R6889 Other general symptoms and signs: Secondary | ICD-10-CM | POA: Diagnosis not present

## 2021-08-25 DIAGNOSIS — Z0000 Encounter for general adult medical examination without abnormal findings: Secondary | ICD-10-CM | POA: Diagnosis not present

## 2021-08-25 DIAGNOSIS — Z9181 History of falling: Secondary | ICD-10-CM | POA: Diagnosis not present

## 2021-08-25 DIAGNOSIS — E785 Hyperlipidemia, unspecified: Secondary | ICD-10-CM | POA: Diagnosis not present

## 2021-10-12 DIAGNOSIS — E1169 Type 2 diabetes mellitus with other specified complication: Secondary | ICD-10-CM | POA: Diagnosis not present

## 2021-10-12 DIAGNOSIS — I10 Essential (primary) hypertension: Secondary | ICD-10-CM | POA: Diagnosis not present

## 2021-10-12 DIAGNOSIS — R062 Wheezing: Secondary | ICD-10-CM | POA: Diagnosis not present

## 2021-10-12 DIAGNOSIS — E785 Hyperlipidemia, unspecified: Secondary | ICD-10-CM | POA: Diagnosis not present

## 2021-10-12 DIAGNOSIS — D509 Iron deficiency anemia, unspecified: Secondary | ICD-10-CM | POA: Diagnosis not present

## 2021-10-12 DIAGNOSIS — E7800 Pure hypercholesterolemia, unspecified: Secondary | ICD-10-CM | POA: Diagnosis not present

## 2021-10-26 ENCOUNTER — Other Ambulatory Visit: Payer: Self-pay | Admitting: Hematology and Oncology

## 2021-10-26 DIAGNOSIS — D509 Iron deficiency anemia, unspecified: Secondary | ICD-10-CM

## 2021-10-28 ENCOUNTER — Other Ambulatory Visit: Payer: Self-pay

## 2021-10-28 ENCOUNTER — Inpatient Hospital Stay: Payer: Medicare Other

## 2021-10-28 ENCOUNTER — Encounter: Payer: Self-pay | Admitting: Hematology and Oncology

## 2021-10-28 ENCOUNTER — Inpatient Hospital Stay: Payer: Medicare Other | Attending: Hematology and Oncology | Admitting: Hematology and Oncology

## 2021-10-28 ENCOUNTER — Other Ambulatory Visit: Payer: Self-pay | Admitting: Hematology and Oncology

## 2021-10-28 ENCOUNTER — Telehealth: Payer: Self-pay

## 2021-10-28 VITALS — BP 99/53 | HR 78 | Temp 98.2°F | Resp 20 | Ht 64.9 in | Wt 152.1 lb

## 2021-10-28 DIAGNOSIS — G4700 Insomnia, unspecified: Secondary | ICD-10-CM | POA: Diagnosis not present

## 2021-10-28 DIAGNOSIS — I10 Essential (primary) hypertension: Secondary | ICD-10-CM | POA: Insufficient documentation

## 2021-10-28 DIAGNOSIS — E119 Type 2 diabetes mellitus without complications: Secondary | ICD-10-CM | POA: Diagnosis not present

## 2021-10-28 DIAGNOSIS — F419 Anxiety disorder, unspecified: Secondary | ICD-10-CM | POA: Insufficient documentation

## 2021-10-28 DIAGNOSIS — D509 Iron deficiency anemia, unspecified: Secondary | ICD-10-CM

## 2021-10-28 DIAGNOSIS — Z79899 Other long term (current) drug therapy: Secondary | ICD-10-CM | POA: Diagnosis not present

## 2021-10-28 DIAGNOSIS — E538 Deficiency of other specified B group vitamins: Secondary | ICD-10-CM | POA: Diagnosis not present

## 2021-10-28 DIAGNOSIS — Z7982 Long term (current) use of aspirin: Secondary | ICD-10-CM | POA: Insufficient documentation

## 2021-10-28 DIAGNOSIS — K2270 Barrett's esophagus without dysplasia: Secondary | ICD-10-CM | POA: Diagnosis not present

## 2021-10-28 DIAGNOSIS — D649 Anemia, unspecified: Secondary | ICD-10-CM | POA: Diagnosis not present

## 2021-10-28 DIAGNOSIS — G2581 Restless legs syndrome: Secondary | ICD-10-CM | POA: Diagnosis not present

## 2021-10-28 DIAGNOSIS — F32A Depression, unspecified: Secondary | ICD-10-CM | POA: Insufficient documentation

## 2021-10-28 DIAGNOSIS — E7800 Pure hypercholesterolemia, unspecified: Secondary | ICD-10-CM | POA: Diagnosis not present

## 2021-10-28 LAB — BASIC METABOLIC PANEL
BUN: 16 (ref 4–21)
CO2: 27 — AB (ref 13–22)
Chloride: 103 (ref 99–108)
Creatinine: 0.6 (ref 0.5–1.1)
Glucose: 181
Potassium: 4.1 mEq/L (ref 3.5–5.1)
Sodium: 140 (ref 137–147)

## 2021-10-28 LAB — IRON AND TIBC
Iron: 33 ug/dL (ref 28–170)
Saturation Ratios: 6 % — ABNORMAL LOW (ref 10.4–31.8)
TIBC: 509 ug/dL — ABNORMAL HIGH (ref 250–450)
UIBC: 476 ug/dL

## 2021-10-28 LAB — CBC AND DIFFERENTIAL
HCT: 34 — AB (ref 36–46)
Hemoglobin: 10 — AB (ref 12.0–16.0)
Neutrophils Absolute: 7.23
Platelets: 392 10*3/uL (ref 150–400)
WBC: 9.9

## 2021-10-28 LAB — TSH: TSH: 1.713 u[IU]/mL (ref 0.350–4.500)

## 2021-10-28 LAB — HEPATIC FUNCTION PANEL
ALT: 14 U/L (ref 7–35)
AST: 27 (ref 13–35)
Alkaline Phosphatase: 64 (ref 25–125)
Bilirubin, Total: 0.6

## 2021-10-28 LAB — COMPREHENSIVE METABOLIC PANEL
Albumin: 4.4 (ref 3.5–5.0)
Calcium: 9.3 (ref 8.7–10.7)

## 2021-10-28 LAB — FERRITIN: Ferritin: 3 ng/mL — ABNORMAL LOW (ref 11–307)

## 2021-10-28 LAB — VITAMIN B12: Vitamin B-12: 749 pg/mL (ref 180–914)

## 2021-10-28 LAB — FOLATE: Folate: 11.6 ng/mL (ref >5.9)

## 2021-10-28 LAB — CBC
MCV: 71 — AB (ref 81–99)
RBC: 4.75 (ref 3.87–5.11)

## 2021-10-28 NOTE — Telephone Encounter (Signed)
Referral faxed

## 2021-10-28 NOTE — Progress Notes (Signed)
[facility]  [address] [place],  [place]  [address] [phone]  Clinic Day:  10/28/2021  Referring physician: Leonides Sake, MD   REASON FOR CONSULTATION:  Iron deficiency anemia  HISTORY OF PRESENT ILLNESS:  Jacqueline Alexander is a 71 y.o. female with iron deficiency anemia who is referred in consultation by Daiva Eves, MD for assessment and management.  On May 31, her hemoglobin was 9.7 with an MCV of 72 and ferritin 5 ug/mL.  White blood count and platelets were normal.  Complete metabolic panel was normal except for glucose 140.  The patient has iron deficiency dating back to June 2022, at which time her hemoglobin was 10.7 with an MCV of 74, and ferritin 5 ug/mL.  She does not tolerate oral iron due to constipation.  She has a history of Barrett's esophagus and her last EGD was in March 2021 at [facility] in [place].  This revealed persistent Barrett's disease.  She continues on pantoprazole 20 mg daily.  Her last colonoscopy was in January 2018 at [facility].  This was normal, so follow-up in 10 years was recommended.  The patient has a history of B12 deficiency and is not taking B12 supplement at this time.  The patient has never had gastric surgery.  She reports chronic diarrhea up to 4 times daily, but the stool is sometimes formed.  She has noted her stools appear more narrow recently.  She denies melena or hematochezia.  She reports intermittent nausea without vomiting.  She has a history of small intestinal bacterial overgrowth and wonders if she may have that again.  She underwent total hysterectomy and bilateral salpingo-oophorectomy with bladder tacking at age 53 for bladder and uterine prolapse.   She denies vaginal bleeding.  She is on aspirin 81 mg daily.  She denies heavy alcohol use, but does drink socially.  In addition to above she has diabetes, hypertension, hypercholesterolemia, arthritis, restless legs, insomnia, anxiety  and depression.  She is on Prozac at night and Ativan as needed.  She is a previous smoker.  She has a family history of a maternal aunt with ovarian cancer, a paternal uncle with stomach cancer and a paternal aunt with lung cancer.  REVIEW OF SYSTEMS:  Review of Systems  Constitutional:  Positive for fatigue. Negative for appetite change, chills, fever and unexpected weight change.  HENT:   Negative for lump/mass, mouth sores and sore throat.   Respiratory:  Negative for cough and shortness of breath.   Cardiovascular:  Negative for chest pain and leg swelling.  Gastrointestinal:  Negative for abdominal pain, constipation, diarrhea, nausea and vomiting.  Endocrine: Negative for hot flashes.  Genitourinary:  Negative for difficulty urinating, dysuria, frequency and hematuria.   Musculoskeletal:  Negative for arthralgias, back pain and myalgias.  Skin:  Negative for rash.  Neurological:  Negative for dizziness and headaches.  Hematological:  Negative for adenopathy. Does not bruise/bleed easily.  Psychiatric/Behavioral:  Positive for depression and sleep disturbance. The patient is nervous/anxious.      VITALS:  Blood pressure (!) 99/53, pulse 78, temperature 98.2 F (36.8 C), temperature source Oral, resp. rate 20, height 5' 4.9" (1.648 m), weight 152 lb 1.6 oz (69 kg), SpO2 94 %.  Wt Readings from Last 3 Encounters:  10/28/21 152 lb 1.6 oz (69 kg)  08/06/19 162 lb (73.5 kg)    Body mass index is 25.39 kg/m.  Performance status (ECOG): 1 - Symptomatic but completely ambulatory  PHYSICAL EXAM:  Physical Exam Vitals and nursing note reviewed.  Constitutional:      General: She is not in acute distress.    Appearance: Normal appearance.  HENT:     Head: Normocephalic and atraumatic.     Mouth/Throat:     Mouth: Mucous membranes are moist.     Pharynx: Oropharynx is clear. No oropharyngeal exudate or posterior oropharyngeal erythema.  Eyes:     General: No scleral icterus.     Extraocular Movements: Extraocular movements intact.     Conjunctiva/sclera: Conjunctivae normal.     Pupils: Pupils are equal, round, and reactive to light.  Cardiovascular:     Rate and Rhythm: Normal rate and regular rhythm.     Heart sounds: Normal heart sounds. No murmur heard.    No friction rub. No gallop.  Pulmonary:     Effort: Pulmonary effort is normal.     Breath sounds: Normal breath sounds. No wheezing, rhonchi or rales.  Abdominal:     General: There is no distension.     Palpations: Abdomen is soft. There is no hepatomegaly, splenomegaly or mass.     Tenderness: There is no abdominal tenderness.  Genitourinary:    Rectum: Normal. Guaiac result negative. No mass, tenderness, anal fissure, external hemorrhoid or internal hemorrhoid. Normal anal tone.  Musculoskeletal:        General: Normal range of motion.     Cervical back: Normal range of motion and neck supple. No tenderness.     Right lower leg: No edema.     Left lower leg: No edema.  Lymphadenopathy:     Cervical: No cervical adenopathy.     Upper Body:     Right upper body: No supraclavicular or axillary adenopathy.     Left upper body: No supraclavicular or axillary adenopathy.     Lower Body: No right inguinal adenopathy. No left inguinal adenopathy.  Skin:    General: Skin is warm and dry.     Coloration: Skin is not jaundiced.     Findings: No rash.  Neurological:     Mental Status: She is alert and oriented to person, place, and time.     Cranial Nerves: No cranial nerve deficit.  Psychiatric:        Mood and Affect: Mood normal.        Behavior: Behavior normal.        Thought Content: Thought content normal.      LABS:      Latest Ref Rng & Units 10/28/2021   12:00 AM  CBC  WBC  9.9      Hemoglobin 12.0 - 16.0 10.0      Hematocrit 36 - 46 34      Platelets 150 - 400 K/uL 392         This result is from an external source.      Latest Ref Rng & Units 10/28/2021   12:00 AM  CMP  BUN  4 - 21 16      Creatinine 0.5 - 1.1 0.6      Sodium 137 - 147 140      Potassium 3.5 - 5.1 mEq/L 4.1      Chloride 99 - 108 103      CO2 13 - 22 27      Calcium 8.7 - 10.7 9.3      Alkaline Phos 25 - 125 64      AST 13 - 35 27  ALT 7 - 35 U/L 14         This result is from an external source.     No results found for: "CEA1", "CEA" / No results found for: "CEA1", "CEA" No results found for: "PSA1" No results found for: "NOI370" No results found for: "CAN125"  No results found for: "TOTALPROTELP", "ALBUMINELP", "A1GS", "A2GS", "BETS", "BETA2SER", "GAMS", "MSPIKE", "SPEI" Lab Results  Component Value Date   TIBC 509 (H) 10/28/2021   FERRITIN 3 (L) 10/28/2021   IRONPCTSAT 6 (L) 10/28/2021   No results found for: "LDH"  STUDIES:  No results found.    HISTORY:   Past Medical History:  Diagnosis Date   Anxiety    Arthritis    Barrett esophagus    Chronic diarrhea    Colon polyps    Depression    Diabetes mellitus without complication (HCC)    GERD (gastroesophageal reflux disease)    Hyperlipemia    Hypertension    Insomnia    Lumbar radiculitis    Mitral insufficiency    Restless leg    Restless leg syndrome    Sensory urge incontinence     Past Surgical History:  Procedure Laterality Date   ABDOMINAL HYSTERECTOMY     ABDOMINAL HYSTERECTOMY     CARPAL TUNNEL RELEASE Left    CHOLECYSTECTOMY     ESOPHAGOGASTRODUODENOSCOPY (EGD) WITH PROPOFOL N/A 08/06/2019   Procedure: ESOPHAGOGASTRODUODENOSCOPY (EGD) WITH PROPOFOL;  Surgeon: Toledo, Benay Pike, MD;  Location: [facility] ENDOSCOPY;  Service: Gastroenterology;  Laterality: N/A;   HERNIA REPAIR     INCONTINENCE SURGERY     OOPHORECTOMY     TONSILLECTOMY      Family History  Problem Relation Age of Onset   Hypertension Mother    Arthritis Mother    Heart disease Mother    Hypertension Father    Tuberculosis Father    Emphysema Father    Heart attack Father    Hyperlipidemia Father    Hypertension  Sister    Stomach cancer Maternal Uncle    Lung cancer Paternal Aunt    Ovarian cancer Paternal Aunt    Breast cancer Neg Hx     Social History:  reports that she quit smoking about 11 years ago. Her smoking use included cigarettes. She has never used smokeless tobacco. She reports that she does not currently use alcohol. She reports that she does not currently use drugs.  She gives a history of smoking up to 1 pack of cigarettes per day on and off since age 42.  She did smoke while pregnant.  She quit 11 years ago.  She still has approximately 38 pack year history of smoking.  She was born and raised in [place] in [place].  She is divorced.  She has 3 children, a son died at age 35 in his sleep, possibly from sleep apnea.  She is a retired Occupational psychologist.  The patient is alone today.  Allergies:  Allergies  Allergen Reactions   Morphine Nausea And Vomiting and Nausea Only   Ace Inhibitors    Ezetimibe     Current Medications: Current Outpatient Medications  Medication Sig Dispense Refill   acidophilus (RISAQUAD) CAPS capsule Take by mouth daily.     aspirin EC 81 MG tablet Take 81 mg by mouth daily. Swallow whole.     estradiol (ESTRACE) 0.1 MG/GM vaginal cream Place 1 Applicatorful vaginally at bedtime.     loratadine (CLARITIN) 10 MG tablet Take 10 mg by mouth  daily.     metFORMIN (GLUCOPHAGE-XR) 750 MG 24 hr tablet Take 750 mg by mouth daily with breakfast.     albuterol (VENTOLIN HFA) 108 (90 Base) MCG/ACT inhaler SMARTSIG:2 Puff(s) By Mouth Every 4-6 Hours PRN     amLODipine (NORVASC) 5 MG tablet Take 5 mg by mouth daily.     carvedilol (COREG) 25 MG tablet Take by mouth.     cholecalciferol (VITAMIN D3) 10 MCG (400 UNIT) TABS tablet Take 1,000 Units by mouth.     Desvenlafaxine Succinate (PRISTIQ PO) Take by mouth.     FLUoxetine (PROZAC) 40 MG capsule Take 40 mg by mouth daily.     LORazepam (ATIVAN) 0.5 MG tablet TK 1 T PO ONCE DAILY PRN  0   pantoprazole (PROTONIX) 20 MG  tablet Take 20 mg by mouth.     SYNJARDY 09-998 MG TABS Take 1 tablet by mouth every morning.     vitamin B-12 (CYANOCOBALAMIN) 250 MCG tablet Take 2,500 mcg by mouth daily.     Vitamin D, Ergocalciferol, (DRISDOL) 50000 units CAPS capsule TK 1 C PO WEEKLY  3   No current facility-administered medications for this visit.     ASSESSMENT & PLAN:   Assessment:  HALAH WHITESIDE is a 71 y.o. female with iron deficiency anemia of uncertain etiology.  She may simply not be absorbing iron, but the possibility of GI blood loss cannot be dismissed.  She also has history of B12 deficiency.  Her maternal aunt had ovarian cancer.  Plan: 1.   Due to her inability to tolerate oral iron, I will arrange for her to have IV iron in the form of Feraheme.  We discussed the most common side effects of Feraheme including, headache, nausea and diarrhea or constipation, as well as the potential for allergic reaction.  2.  Although she has had fairly recent GI evaluation, I recommend she be considered for repeat EGD and colonoscopy to evaluate the current iron deficiency.  We will refer her back to [facility]. 3.  History of B12 deficiency.  B12 is normal today. 4.  Family history of ovarian cancer in a second-degree relative.  I recommend she meet with the genetic counselor for discussion of testing for hereditary cancer syndromes.   I discussed the assessment and plan with the patient.  She was provided an opportunity to ask questions and all were answered.  She agreed with the plan and demonstrated an understanding of the instructions.  The patient was advised to call back if she has symptoms of worsening anemia or her symptoms fail to improve as anticipated.  I will plan to see her back in 2 months for repeat clinical assessment.  Thank you for the opportunity lovely woman.   I provided 45 minutes of face-to-face time during this encounter and > 50% was spent counseling as documented under my assessment and  plan.    Marvia Pickles, PA-C

## 2021-10-28 NOTE — Telephone Encounter (Signed)
-----   Message from Marvia Pickles, PA-C sent at 10/28/2021 12:05 PM EDT ----- Please refer to GI-evaluate iron deficiency anemia. Last seen at [facility]. Thank you!

## 2021-10-31 ENCOUNTER — Encounter: Payer: Self-pay | Admitting: Hematology and Oncology

## 2021-10-31 DIAGNOSIS — D509 Iron deficiency anemia, unspecified: Secondary | ICD-10-CM | POA: Insufficient documentation

## 2021-10-31 DIAGNOSIS — M6289 Other specified disorders of muscle: Secondary | ICD-10-CM | POA: Diagnosis not present

## 2021-10-31 DIAGNOSIS — K2270 Barrett's esophagus without dysplasia: Secondary | ICD-10-CM | POA: Diagnosis not present

## 2021-10-31 DIAGNOSIS — R195 Other fecal abnormalities: Secondary | ICD-10-CM | POA: Diagnosis not present

## 2021-10-31 MED FILL — Ferumoxytol Inj 510 MG/17ML (30 MG/ML) (Elemental Fe): INTRAVENOUS | Qty: 17 | Status: AC

## 2021-11-01 ENCOUNTER — Telehealth: Payer: Self-pay | Admitting: Genetic Counselor

## 2021-11-01 ENCOUNTER — Inpatient Hospital Stay: Payer: Medicare Other

## 2021-11-01 VITALS — BP 140/60 | HR 71 | Temp 97.6°F | Resp 16

## 2021-11-01 DIAGNOSIS — K2270 Barrett's esophagus without dysplasia: Secondary | ICD-10-CM | POA: Diagnosis not present

## 2021-11-01 DIAGNOSIS — I10 Essential (primary) hypertension: Secondary | ICD-10-CM | POA: Diagnosis not present

## 2021-11-01 DIAGNOSIS — E119 Type 2 diabetes mellitus without complications: Secondary | ICD-10-CM | POA: Diagnosis not present

## 2021-11-01 DIAGNOSIS — Z7982 Long term (current) use of aspirin: Secondary | ICD-10-CM | POA: Diagnosis not present

## 2021-11-01 DIAGNOSIS — G2581 Restless legs syndrome: Secondary | ICD-10-CM | POA: Diagnosis not present

## 2021-11-01 DIAGNOSIS — D509 Iron deficiency anemia, unspecified: Secondary | ICD-10-CM | POA: Diagnosis not present

## 2021-11-01 DIAGNOSIS — E7800 Pure hypercholesterolemia, unspecified: Secondary | ICD-10-CM | POA: Diagnosis not present

## 2021-11-01 DIAGNOSIS — G4700 Insomnia, unspecified: Secondary | ICD-10-CM | POA: Diagnosis not present

## 2021-11-01 DIAGNOSIS — Z79899 Other long term (current) drug therapy: Secondary | ICD-10-CM | POA: Diagnosis not present

## 2021-11-01 DIAGNOSIS — F32A Depression, unspecified: Secondary | ICD-10-CM | POA: Diagnosis not present

## 2021-11-01 DIAGNOSIS — E538 Deficiency of other specified B group vitamins: Secondary | ICD-10-CM | POA: Diagnosis not present

## 2021-11-01 MED ORDER — ACETAMINOPHEN 325 MG PO TABS
650.0000 mg | ORAL_TABLET | Freq: Once | ORAL | Status: AC
  Administered 2021-11-01: 650 mg via ORAL
  Filled 2021-11-01: qty 2

## 2021-11-01 MED ORDER — SODIUM CHLORIDE 0.9 % IV SOLN: Freq: Once | INTRAVENOUS | Status: AC

## 2021-11-01 MED ORDER — FAMOTIDINE IN NACL 20-0.9 MG/50ML-% IV SOLN
20.0000 mg | Freq: Once | INTRAVENOUS | Status: AC
  Administered 2021-11-01: 20 mg via INTRAVENOUS
  Filled 2021-11-01: qty 50

## 2021-11-01 MED ORDER — SODIUM CHLORIDE 0.9 % IV SOLN
510.0000 mg | Freq: Once | INTRAVENOUS | Status: AC
  Administered 2021-11-01: 510 mg via INTRAVENOUS
  Filled 2021-11-01: qty 510

## 2021-11-01 NOTE — Patient Instructions (Signed)

## 2021-11-01 NOTE — Telephone Encounter (Signed)
Scheduled appt per 6/16 referral. Pt is aware of appt date and time. Pt is aware to arrive 15 mins prior to appt time and to bring and updated insurance card. Pt is aware of appt location.   

## 2021-11-08 ENCOUNTER — Inpatient Hospital Stay: Payer: Medicare Other

## 2021-11-08 VITALS — BP 111/56 | HR 74 | Temp 98.0°F | Resp 20 | Ht 64.9 in | Wt 152.8 lb

## 2021-11-08 DIAGNOSIS — E119 Type 2 diabetes mellitus without complications: Secondary | ICD-10-CM | POA: Diagnosis not present

## 2021-11-08 DIAGNOSIS — D509 Iron deficiency anemia, unspecified: Secondary | ICD-10-CM

## 2021-11-08 DIAGNOSIS — G4700 Insomnia, unspecified: Secondary | ICD-10-CM | POA: Diagnosis not present

## 2021-11-08 DIAGNOSIS — G2581 Restless legs syndrome: Secondary | ICD-10-CM | POA: Diagnosis not present

## 2021-11-08 DIAGNOSIS — F32A Depression, unspecified: Secondary | ICD-10-CM | POA: Diagnosis not present

## 2021-11-08 DIAGNOSIS — E538 Deficiency of other specified B group vitamins: Secondary | ICD-10-CM | POA: Diagnosis not present

## 2021-11-08 DIAGNOSIS — Z7982 Long term (current) use of aspirin: Secondary | ICD-10-CM | POA: Diagnosis not present

## 2021-11-08 DIAGNOSIS — I10 Essential (primary) hypertension: Secondary | ICD-10-CM | POA: Diagnosis not present

## 2021-11-08 DIAGNOSIS — E7800 Pure hypercholesterolemia, unspecified: Secondary | ICD-10-CM | POA: Diagnosis not present

## 2021-11-08 DIAGNOSIS — K2270 Barrett's esophagus without dysplasia: Secondary | ICD-10-CM | POA: Diagnosis not present

## 2021-11-08 DIAGNOSIS — Z79899 Other long term (current) drug therapy: Secondary | ICD-10-CM | POA: Diagnosis not present

## 2021-11-08 MED ORDER — SODIUM CHLORIDE 0.9 % IV SOLN: Freq: Once | INTRAVENOUS | Status: AC

## 2021-11-08 MED ORDER — FAMOTIDINE IN NACL 20-0.9 MG/50ML-% IV SOLN
20.0000 mg | Freq: Once | INTRAVENOUS | Status: AC
  Administered 2021-11-08: 20 mg via INTRAVENOUS
  Filled 2021-11-08: qty 50

## 2021-11-08 MED ORDER — SODIUM CHLORIDE 0.9 % IV SOLN
510.0000 mg | Freq: Once | INTRAVENOUS | Status: AC
  Administered 2021-11-08: 510 mg via INTRAVENOUS
  Filled 2021-11-08: qty 510

## 2021-11-08 MED ORDER — ACETAMINOPHEN 325 MG PO TABS
650.0000 mg | ORAL_TABLET | Freq: Once | ORAL | Status: AC
  Administered 2021-11-08: 650 mg via ORAL
  Filled 2021-11-08: qty 2

## 2021-11-09 ENCOUNTER — Encounter: Payer: Self-pay | Admitting: Gastroenterology

## 2021-11-09 NOTE — H&P (Signed)
Pre-Procedure H&P   Patient ID: Jacqueline Alexander is a 71 y.o. female.  Gastroenterology Provider: Annamaria Helling, DO  Referring Provider: Octavia Bruckner, PA PCP: Leonides Sake, MD  Date: 11/10/2021  HPI [name] is a 71 y.o. female who presents today for Esophagogastroduodenoscopy and Colonoscopy for Iron deficiency anemia.  Patient with new onset iron deficiency anemia as of June 2022 when she was noted to have a hemoglobin of 10.7 MCV 74 ferritin of 5.  She denies any GI bleeding symptoms.  She has a change in stool caliber and frequency ranging from 2 to 3 days with loose stools or day without a stool at all. Repeat labs this month demonstrate hemoglobin 10 MCV 71 ferritin 3 saturation 6% TIBC 519 iron 33 despite iron supplementation.  Folate is 11.6 B12 769.  Feels she has lost about 9 lbs over the last month, but gained some of it back. She denies any abdominal pain or changes in appetite. No night sweats  Underwent bidirectional endoscopy in January 2018 demonstrated short segment Barrett's esophagus without dysplasia and otherwise normal.  Colonoscopy was normal in 2018.  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  Repeat EGD in March 2021 demonstrated short segment Barrett's esophagus without dysplasia again with normal stomach and duodenum.  No family history of colon cancer or colon polyps.  She is status post hysterectomy.  Previous tobacco use   Past Medical History:  Diagnosis Date   Anxiety    Arthritis    Barrett esophagus    Chronic diarrhea    Colon polyps    Depression    Diabetes mellitus without complication (HCC)    GERD (gastroesophageal reflux disease)    Hyperlipemia    Hypertension    Insomnia    Lumbar radiculitis    Mitral insufficiency    Restless leg    Restless leg syndrome    Sensory urge incontinence     Past Surgical History:  Procedure Laterality Date    ABDOMINAL HYSTERECTOMY     ABDOMINAL HYSTERECTOMY     CARPAL TUNNEL RELEASE Left    CHOLECYSTECTOMY     ESOPHAGOGASTRODUODENOSCOPY (EGD) WITH PROPOFOL N/A 08/06/2019   Procedure: ESOPHAGOGASTRODUODENOSCOPY (EGD) WITH PROPOFOL;  Surgeon: Toledo, Benay Pike, MD;  Location: [facility] ENDOSCOPY;  Service: Gastroenterology;  Laterality: N/A;   HERNIA REPAIR     INCONTINENCE SURGERY     OOPHORECTOMY     TONSILLECTOMY      Family History No h/o GI disease or malignancy  Review of Systems  Constitutional:  Negative for activity change, appetite change, chills, diaphoresis, fatigue, fever and unexpected weight change.  HENT:  Negative for trouble swallowing and voice change.   Respiratory:  Negative for shortness of breath and wheezing.   Cardiovascular:  Negative for chest pain, palpitations and leg swelling.  Gastrointestinal:  Positive for diarrhea. Negative for abdominal distention, abdominal pain, anal bleeding, blood in stool, constipation, nausea, rectal pain and vomiting.       Change in stool caliber  Musculoskeletal:  Negative for arthralgias and myalgias.  Skin:  Negative for color change and pallor.  Neurological:  Negative for dizziness, syncope and weakness.  Psychiatric/Behavioral:  Negative for confusion.   All other systems reviewed and are negative.    Medications No current facility-administered medications on file prior to encounter.   Current Outpatient Medications on File Prior to Encounter  Medication Sig Dispense Refill   albuterol (VENTOLIN  HFA) 108 (90 Base) MCG/ACT inhaler SMARTSIG:2 Puff(s) By Mouth Every 4-6 Hours PRN     amLODipine (NORVASC) 5 MG tablet Take 5 mg by mouth daily.     carvedilol (COREG) 25 MG tablet Take by mouth.     cholecalciferol (VITAMIN D3) 10 MCG (400 UNIT) TABS tablet Take 1,000 Units by mouth.     metFORMIN (GLUCOPHAGE-XR) 750 MG 24 hr tablet metformin ER 750 mg tablet,extended release 24 hr  TAKE 1 TABLET BY MOUTH TWICE DAILY WITH FOOD  FOR DIABETES     pantoprazole (PROTONIX) 40 MG tablet pantoprazole 40 mg tablet,delayed release  TAKE 1 TABLET BY MOUTH DAILY FOR STOMACH ACID OR REFLUX     SYNJARDY 09-998 MG TABS Take 1 tablet by mouth every morning.     vitamin B-12 (CYANOCOBALAMIN) 250 MCG tablet Take 2,500 mcg by mouth daily.     acidophilus (RISAQUAD) CAPS capsule Take by mouth daily.     aspirin EC 81 MG tablet Take 81 mg by mouth daily. Swallow whole.     Cysteamine Bitartrate (PROCYSBI) 300 MG PACK lancets 30 gauge     estradiol (ESTRACE) 0.1 MG/GM vaginal cream Place 1 Applicatorful vaginally at bedtime.     FLUoxetine (PROZAC) 40 MG capsule Take 40 mg by mouth daily.     LORazepam (ATIVAN) 0.5 MG tablet TK 1 T PO ONCE DAILY PRN  0    Pertinent medications related to GI and procedure were reviewed by me with the patient prior to the procedure   Current Facility-Administered Medications:    0.9 %  sodium chloride infusion, , Intravenous, Continuous, Annamaria Helling, DO, Last Rate: 20 mL/hr at 11/10/21 0800, Restarted at 11/10/21 0802      Allergies  Allergen Reactions   Morphine Nausea And Vomiting and Nausea Only   Ace Inhibitors    Ezetimibe    Allergies were reviewed by me prior to the procedure  Objective   Body mass index is 25.35 kg/m. Vitals:   11/10/21 0739  BP: 130/70  Pulse: 81  Resp: 18  Temp: (!) 96.9 F (36.1 C)  TempSrc: Temporal  SpO2: 98%  Weight: 68 kg  Height: 5' 4.5" (1.638 m)     Physical Exam Vitals and nursing note reviewed.  Constitutional:      General: She is not in acute distress.    Appearance: Normal appearance. She is not ill-appearing, toxic-appearing or diaphoretic.  HENT:     Head: Normocephalic and atraumatic.     Nose: Nose normal.     Mouth/Throat:     Mouth: Mucous membranes are moist.     Pharynx: Oropharynx is clear.     Comments: Lower posts for dentures. Eyes:     General: No scleral icterus.    Extraocular Movements: Extraocular  movements intact.  Cardiovascular:     Rate and Rhythm: Normal rate and regular rhythm.     Heart sounds: Normal heart sounds. No murmur heard.    No friction rub. No gallop.  Pulmonary:     Effort: Pulmonary effort is normal. No respiratory distress.     Breath sounds: Normal breath sounds. No wheezing, rhonchi or rales.  Abdominal:     General: Bowel sounds are normal. There is no distension.     Palpations: Abdomen is soft.     Tenderness: There is no abdominal tenderness. There is no guarding or rebound.  Musculoskeletal:     Cervical back: Neck supple.     Right lower leg: No  edema.     Left lower leg: No edema.  Skin:    General: Skin is warm and dry.     Coloration: Skin is not jaundiced or pale.  Neurological:     General: No focal deficit present.     Mental Status: She is alert and oriented to person, place, and time. Mental status is at baseline.  Psychiatric:        Mood and Affect: Mood normal.        Behavior: Behavior normal.        Thought Content: Thought content normal.        Judgment: Judgment normal.      Assessment:  Ms. AINARA ELDRIDGE is a 71 y.o. female  who presents today for Esophagogastroduodenoscopy and Colonoscopy for Iron deficiency anemia.  Plan:  Esophagogastroduodenoscopy and Colonoscopy with possible intervention today  Esophagogastroduodenoscopy and Colonoscopy with possible biopsy, control of bleeding, polypectomy, and interventions as necessary has been discussed with the patient/patient representative. Informed consent was obtained from the patient/patient representative after explaining the indication, nature, and risks of the procedure including but not limited to death, bleeding, perforation, missed neoplasm/lesions, cardiorespiratory compromise, and reaction to medications. Opportunity for questions was given and appropriate answers were provided. Patient/patient representative has verbalized understanding is amenable to undergoing the  procedure.   Annamaria Helling, DO  [facility]  Portions of the record may have been created with voice recognition software. Occasional wrong-word or 'sound-a-like' substitutions may have occurred due to the inherent limitations of voice recognition software.  Read the chart carefully and recognize, using context, where substitutions may have occurred.

## 2021-11-10 ENCOUNTER — Encounter: Payer: Self-pay | Admitting: Gastroenterology

## 2021-11-10 ENCOUNTER — Ambulatory Visit
Admission: RE | Admit: 2021-11-10 | Discharge: 2021-11-10 | Disposition: A | Discharge status: Home / Self Care | Payer: Medicare Other | Attending: Gastroenterology | Admitting: Gastroenterology

## 2021-11-10 ENCOUNTER — Other Ambulatory Visit: Payer: Self-pay

## 2021-11-10 ENCOUNTER — Ambulatory Visit: Payer: Medicare Other | Admitting: Certified Registered Nurse Anesthetist

## 2021-11-10 ENCOUNTER — Encounter
Admission: RE | Disposition: A | Discharge status: Home / Self Care | Payer: Self-pay | Source: Home / Self Care | Attending: Gastroenterology

## 2021-11-10 DIAGNOSIS — Z7984 Long term (current) use of oral hypoglycemic drugs: Secondary | ICD-10-CM | POA: Insufficient documentation

## 2021-11-10 DIAGNOSIS — F419 Anxiety disorder, unspecified: Secondary | ICD-10-CM | POA: Insufficient documentation

## 2021-11-10 DIAGNOSIS — K317 Polyp of stomach and duodenum: Secondary | ICD-10-CM | POA: Insufficient documentation

## 2021-11-10 DIAGNOSIS — K621 Rectal polyp: Secondary | ICD-10-CM | POA: Diagnosis not present

## 2021-11-10 DIAGNOSIS — K219 Gastro-esophageal reflux disease without esophagitis: Secondary | ICD-10-CM | POA: Diagnosis not present

## 2021-11-10 DIAGNOSIS — I10 Essential (primary) hypertension: Secondary | ICD-10-CM | POA: Insufficient documentation

## 2021-11-10 DIAGNOSIS — K635 Polyp of colon: Secondary | ICD-10-CM | POA: Insufficient documentation

## 2021-11-10 DIAGNOSIS — K2289 Other specified disease of esophagus: Secondary | ICD-10-CM | POA: Insufficient documentation

## 2021-11-10 DIAGNOSIS — E785 Hyperlipidemia, unspecified: Secondary | ICD-10-CM | POA: Diagnosis not present

## 2021-11-10 DIAGNOSIS — F32A Depression, unspecified: Secondary | ICD-10-CM | POA: Insufficient documentation

## 2021-11-10 DIAGNOSIS — Z79899 Other long term (current) drug therapy: Secondary | ICD-10-CM | POA: Insufficient documentation

## 2021-11-10 DIAGNOSIS — K640 First degree hemorrhoids: Secondary | ICD-10-CM | POA: Diagnosis not present

## 2021-11-10 DIAGNOSIS — E119 Type 2 diabetes mellitus without complications: Secondary | ICD-10-CM | POA: Insufficient documentation

## 2021-11-10 DIAGNOSIS — D131 Benign neoplasm of stomach: Secondary | ICD-10-CM | POA: Diagnosis not present

## 2021-11-10 DIAGNOSIS — D509 Iron deficiency anemia, unspecified: Secondary | ICD-10-CM | POA: Insufficient documentation

## 2021-11-10 DIAGNOSIS — K2270 Barrett's esophagus without dysplasia: Secondary | ICD-10-CM | POA: Insufficient documentation

## 2021-11-10 DIAGNOSIS — D126 Benign neoplasm of colon, unspecified: Secondary | ICD-10-CM | POA: Diagnosis not present

## 2021-11-10 DIAGNOSIS — K22710 Barrett's esophagus with low grade dysplasia: Secondary | ICD-10-CM | POA: Diagnosis not present

## 2021-11-10 DIAGNOSIS — Z87891 Personal history of nicotine dependence: Secondary | ICD-10-CM | POA: Diagnosis not present

## 2021-11-10 HISTORY — PX: COLONOSCOPY: SHX5424

## 2021-11-10 HISTORY — PX: ESOPHAGOGASTRODUODENOSCOPY: SHX5428

## 2021-11-10 LAB — GLUCOSE, CAPILLARY: Glucose-Capillary: 158 mg/dL — ABNORMAL HIGH (ref 70–99)

## 2021-11-10 SURGERY — EGD (ESOPHAGOGASTRODUODENOSCOPY)
Anesthesia: General

## 2021-11-10 MED ORDER — PHENYLEPHRINE 80 MCG/ML (10ML) SYRINGE FOR IV PUSH (FOR BLOOD PRESSURE SUPPORT)
PREFILLED_SYRINGE | INTRAVENOUS | Status: AC
  Filled 2021-11-10: qty 10

## 2021-11-10 MED ORDER — PROPOFOL 1000 MG/100ML IV EMUL
INTRAVENOUS | Status: AC
  Filled 2021-11-10: qty 100

## 2021-11-10 MED ORDER — LIDOCAINE HCL (CARDIAC) PF 100 MG/5ML IV SOSY
PREFILLED_SYRINGE | INTRAVENOUS | Status: DC | PRN
  Administered 2021-11-10: 50 mg via INTRAVENOUS

## 2021-11-10 MED ORDER — PHENYLEPHRINE HCL (PRESSORS) 10 MG/ML IV SOLN
INTRAVENOUS | Status: DC | PRN
  Administered 2021-11-10 (×2): 160 ug via INTRAVENOUS
  Administered 2021-11-10: 80 ug via INTRAVENOUS

## 2021-11-10 MED ORDER — SODIUM CHLORIDE 0.9 % IV SOLN
INTRAVENOUS | Status: DC
Start: 2021-11-10 — End: 2021-11-10

## 2021-11-10 MED ORDER — PROPOFOL 10 MG/ML IV BOLUS
INTRAVENOUS | Status: DC | PRN
  Administered 2021-11-10: 80 mg via INTRAVENOUS

## 2021-11-10 MED ORDER — PROPOFOL 500 MG/50ML IV EMUL
INTRAVENOUS | Status: DC | PRN
  Administered 2021-11-10: 150 ug/kg/min via INTRAVENOUS

## 2021-11-10 NOTE — Anesthesia Preprocedure Evaluation (Signed)
Anesthesia Evaluation  Patient identified by MRN, date of birth, ID band Patient awake    Reviewed: Allergy & Precautions, NPO status , Patient's Chart, lab work & pertinent test results  History of Anesthesia Complications Negative for: history of anesthetic complications  Airway Mallampati: II  TM Distance: >3 FB Neck ROM: Full    Dental  (+) Edentulous Upper, Edentulous Lower   Pulmonary neg sleep apnea, neg COPD, Patient abstained from smoking.Not current smoker, former smoker,    Pulmonary exam normal breath sounds clear to auscultation       Cardiovascular Exercise Tolerance: Good METShypertension, Pt. on medications (-) CAD and (-) Past MI (-) dysrhythmias  Rhythm:Regular Rate:Normal - Systolic murmurs    Neuro/Psych PSYCHIATRIC DISORDERS Anxiety Depression negative neurological ROS     GI/Hepatic GERD  Medicated and Controlled,(+)     (-) substance abuse  ,   Endo/Other  diabetes, Well Controlled  Renal/GU negative Renal ROS     Musculoskeletal   Abdominal   Peds  Hematology   Anesthesia Other Findings Past Medical History: No date: Anxiety No date: Arthritis No date: Barrett esophagus No date: Chronic diarrhea No date: Colon polyps No date: Depression No date: Diabetes mellitus without complication (HCC) No date: GERD (gastroesophageal reflux disease) No date: Hyperlipemia No date: Hypertension No date: Insomnia No date: Lumbar radiculitis No date: Mitral insufficiency No date: Restless leg No date: Restless leg syndrome No date: Sensory urge incontinence  Reproductive/Obstetrics                             Anesthesia Physical Anesthesia Plan  ASA: 2  Anesthesia Plan: General   Post-op Pain Management: Minimal or no pain anticipated   Induction: Intravenous  PONV Risk Score and Plan: 3 and Propofol infusion, TIVA and Ondansetron  Airway Management Planned:  Nasal Cannula  Additional Equipment: None  Intra-op Plan:   Post-operative Plan:   Informed Consent: I have reviewed the patients History and Physical, chart, labs and discussed the procedure including the risks, benefits and alternatives for the proposed anesthesia with the patient or authorized representative who has indicated his/her understanding and acceptance.     Dental advisory given  Plan Discussed with: CRNA and Surgeon  Anesthesia Plan Comments: (Discussed risks of anesthesia with patient, including possibility of difficulty with spontaneous ventilation under anesthesia necessitating airway intervention, PONV, and rare risks such as cardiac or respiratory or neurological events, and allergic reactions. Discussed the role of CRNA in patient's perioperative care. Patient understands.)        Anesthesia Quick Evaluation

## 2021-11-10 NOTE — Op Note (Signed)
[facility] Patient Name: Jacqueline Alexander Procedure Date: 11/10/2021 8:07 AM MRN: [id number] Account #: [id number] Date of Birth: 1951/02/10 Admit Type: Outpatient Age: 71 Room: [facility] ENDO ROOM 1 Gender: Female Note Status: Finalized Instrument Name: Upper Endoscope [id number] Procedure:             Upper GI endoscopy Indications:           Iron deficiency anemia Providers:             Annamaria Helling DO, DO Referring MD:          Lorin Mercy. Hamrick (Referring MD) Medicines:             Monitored Anesthesia Care Complications:         No immediate complications. Estimated blood loss:                         Minimal. Procedure:             Pre-Anesthesia Assessment:                        - Prior to the procedure, a History and Physical was                         performed, and patient medications and allergies were                         reviewed. The patient is competent. The risks and                         benefits of the procedure and the sedation options and                         risks were discussed with the patient. All questions                         were answered and informed consent was obtained.                         Patient identification and proposed procedure were                         verified by the physician, the nurse, the anesthetist                         and the technician in the endoscopy suite. Mental                         Status Examination: alert and oriented. Airway                         Examination: normal oropharyngeal airway and neck                         mobility. Respiratory Examination: clear to                         auscultation. CV Examination: RRR, no murmurs, no S3  or S4. Prophylactic Antibiotics: The patient does not                         require prophylactic antibiotics. Prior                         Anticoagulants: The patient has taken no previous                          anticoagulant or antiplatelet agents. ASA Grade                         Assessment: II - A patient with mild systemic disease.                         After reviewing the risks and benefits, the patient                         was deemed in satisfactory condition to undergo the                         procedure. The anesthesia plan was to use monitored                         anesthesia care (MAC). Immediately prior to                         administration of medications, the patient was                         re-assessed for adequacy to receive sedatives. The                         heart rate, respiratory rate, oxygen saturations,                         blood pressure, adequacy of pulmonary ventilation, and                         response to care were monitored throughout the                         procedure. The physical status of the patient was                         re-assessed after the procedure.                        After obtaining informed consent, the endoscope was                         passed under direct vision. Throughout the procedure,                         the patient's blood pressure, pulse, and oxygen                         saturations were monitored continuously. The Endoscope  was introduced through the mouth, and advanced to the                         second part of duodenum. The upper GI endoscopy was                         accomplished without difficulty. The patient tolerated                         the procedure well. Findings:      The duodenal bulb, first portion of the duodenum and second portion of       the duodenum were normal. Biopsies for histology were taken with a cold       forceps for evaluation of celiac disease. Estimated blood loss was       minimal.      Multiple less than 5 mm sessile polyps with no bleeding and no stigmata       of recent bleeding were found on the greater curvature of the stomach.        Biopsies were taken with a cold forceps for histology. Estimated blood       loss was minimal.      Normal mucosa was found in the entire examined stomach. Biopsies were       taken with a cold forceps for Helicobacter pylori testing. Estimated       blood loss was minimal.      The exam of the stomach was otherwise normal.      Esophagogastric landmarks were identified: the gastroesophageal junction       was found at 37 cm from the incisors.      The Z-line was irregular.      There were esophageal mucosal changes secondary to established       short-segment Barrett's disease, classified as Barrett's stage C0-M2 per       [place] criteria present in the lower third of the esophagus. The maximum       longitudinal extent of these mucosal changes was 2 cm in length. Mucosa       was biopsied with a cold forceps for histology. One specimen bottle was       sent to pathology. Estimated blood loss was minimal.      The exam of the esophagus was otherwise normal. Impression:            - Normal duodenal bulb, first portion of the duodenum                         and second portion of the duodenum. Biopsied.                        - Multiple gastric polyps. Biopsied.                        - Normal mucosa was found in the entire stomach.                         Biopsied.                        - Esophagogastric landmarks identified.                        -  Z-line irregular.                        - Esophageal mucosal changes secondary to established                         short-segment Barrett's disease, classified as                         Barrett's stage C0-M2 per [place] criteria. Biopsied. Recommendation:        - Discharge patient to home.                        - Resume previous diet.                        - Continue present medications.                        - Await pathology results.                        - Repeat upper endoscopy in 3 years for surveillance                          of Barrett's esophagus.                        - Return to GI clinic as previously scheduled.                        - The findings and recommendations were discussed with                         the patient.                        - proceed with colonoscopy Procedure Code(s):     --- Professional ---                        [phone], Esophagogastroduodenoscopy, flexible,                         transoral; with biopsy, single or multiple Diagnosis Code(s):     --- Professional ---                        K22.70, Barrett's esophagus without dysplasia                        K31.7, Polyp of stomach and duodenum                        K22.8, Other specified diseases of esophagus                        D50.9, Iron deficiency anemia, unspecified CPT copyright 2019 American Medical Association. All rights reserved. The codes documented in this report are preliminary and upon coder review may  be revised to meet current compliance requirements. Attending Participation:      I personally performed the entire procedure. Volney American, DO Annamaria Helling DO, DO  11/10/2021 8:42:05 AM This report has been signed electronically. Number of Addenda: 0 Note Initiated On: 11/10/2021 8:07 AM Estimated Blood Loss:  Estimated blood loss was minimal.      [facility]

## 2021-11-10 NOTE — Anesthesia Procedure Notes (Signed)
Date/Time: 11/10/2021 8:20 AM  Performed by: Johnna Acosta, CRNAPre-anesthesia Checklist: Patient identified, Emergency Drugs available, Suction available, Patient being monitored and Timeout performed Patient Re-evaluated:Patient Re-evaluated prior to induction Oxygen Delivery Method: Nasal cannula Preoxygenation: Pre-oxygenation with 100% oxygen Induction Type: IV induction

## 2021-11-10 NOTE — Op Note (Signed)
[facility] Patient Name: Jacqueline Alexander Procedure Date: 11/10/2021 8:06 AM MRN: [id number] Account #: [id number] Date of Birth: February 22, 1951 Admit Type: Outpatient Age: 71 Room: [facility] ENDO ROOM 1 Gender: Female Note Status: Finalized Instrument Name: Colonoscope [id number] Procedure:             Colonoscopy Indications:           Iron deficiency anemia Providers:             Annamaria Helling DO, DO Referring MD:          Lorin Mercy. Hamrick (Referring MD) Medicines:             Monitored Anesthesia Care Complications:         No immediate complications. Estimated blood loss:                         Minimal. Procedure:             Pre-Anesthesia Assessment:                        - Prior to the procedure, a History and Physical was                         performed, and patient medications and allergies were                         reviewed. The patient is competent. The risks and                         benefits of the procedure and the sedation options and                         risks were discussed with the patient. All questions                         were answered and informed consent was obtained.                         Patient identification and proposed procedure were                         verified by the physician, the nurse, the anesthetist                         and the technician in the endoscopy suite. Mental                         Status Examination: alert and oriented. Airway                         Examination: normal oropharyngeal airway and neck                         mobility. Respiratory Examination: clear to                         auscultation. CV Examination: RRR, no murmurs, no S3  or S4. Prophylactic Antibiotics: The patient does not                         require prophylactic antibiotics. Prior                         Anticoagulants: The patient has taken no previous                          anticoagulant or antiplatelet agents. ASA Grade                         Assessment: II - A patient with mild systemic disease.                         After reviewing the risks and benefits, the patient                         was deemed in satisfactory condition to undergo the                         procedure. The anesthesia plan was to use monitored                         anesthesia care (MAC). Immediately prior to                         administration of medications, the patient was                         re-assessed for adequacy to receive sedatives. The                         heart rate, respiratory rate, oxygen saturations,                         blood pressure, adequacy of pulmonary ventilation, and                         response to care were monitored throughout the                         procedure. The physical status of the patient was                         re-assessed after the procedure.                        After obtaining informed consent, the colonoscope was                         passed under direct vision. Throughout the procedure,                         the patient's blood pressure, pulse, and oxygen                         saturations were monitored continuously. The  Colonoscope was introduced through the anus and                         advanced to the the terminal ileum, with                         identification of the appendiceal orifice and IC                         valve. The colonoscopy was performed without                         difficulty. The patient tolerated the procedure well.                         The quality of the bowel preparation was evaluated                         using the BBPS [facility] Bowel Preparation Scale) with                         scores of: Right Colon = 2 (minor amount of residual                         staining, small fragments of stool and/or opaque                         liquid, but mucosa  seen well), Transverse Colon = 3                         (entire mucosa seen well with no residual staining,                         small fragments of stool or opaque liquid) and Left                         Colon = 2 (minor amount of residual staining, small                         fragments of stool and/or opaque liquid, but mucosa                         seen well). The total BBPS score equals 7. The quality                         of the bowel preparation was good. The terminal ileum,                         ileocecal valve, appendiceal orifice, and rectum were                         photographed. Findings:      The perianal and digital rectal examinations were normal. Pertinent       negatives include normal sphincter tone.      The terminal ileum appeared normal. Estimated blood loss: none.      Two sessile polyps were found in the rectum and  transverse colon. The       polyps were 1 to 2 mm in size. These polyps were removed with a jumbo       cold forceps. Resection and retrieval were complete. Estimated blood       loss was minimal.      Non-bleeding internal hemorrhoids were found during retroflexion. The       hemorrhoids were Grade I (internal hemorrhoids that do not prolapse).       Estimated blood loss: none.      No fresh blood, old blood, avms, or large lesions appreciated.      The exam was otherwise without abnormality on direct and retroflexion       views. Impression:            - The examined portion of the ileum was normal.                        - Two 1 to 2 mm polyps in the rectum and in the                         transverse colon, removed with a jumbo cold forceps.                         Resected and retrieved.                        - Non-bleeding internal hemorrhoids.                        - The examination was otherwise normal on direct and                         retroflexion views. Recommendation:        - Discharge patient to home.                         - Resume previous diet.                        - Continue present medications.                        - Await pathology results.                        - Repeat colonoscopy for surveillance based on                         pathology results.                        - Return to GI office as previously scheduled.                        - The findings and recommendations were discussed with                         the patient. Procedure Code(s):     --- Professional ---                        [phone], Colonoscopy, flexible; with  biopsy, single or                         multiple Diagnosis Code(s):     --- Professional ---                        K64.0, First degree hemorrhoids                        K62.1, Rectal polyp                        K63.5, Polyp of colon                        D50.9, Iron deficiency anemia, unspecified CPT copyright 2019 American Medical Association. All rights reserved. The codes documented in this report are preliminary and upon coder review may  be revised to meet current compliance requirements. Attending Participation:      I personally performed the entire procedure. Volney American, DO Annamaria Helling DO, DO 11/10/2021 9:18:38 AM This report has been signed electronically. Number of Addenda: 0 Note Initiated On: 11/10/2021 8:06 AM Scope Withdrawal Time: 0 hours 24 minutes 5 seconds  Total Procedure Duration: 0 hours 28 minutes 35 seconds  Estimated Blood Loss:  Estimated blood loss was minimal.      [facility]

## 2021-11-10 NOTE — Anesthesia Postprocedure Evaluation (Signed)
Anesthesia Post Note  Patient: Jacqueline Alexander  Procedure(s) Performed: ESOPHAGOGASTRODUODENOSCOPY (EGD) COLONOSCOPY  Patient location during evaluation: Endoscopy Anesthesia Type: General Level of consciousness: awake and alert Pain management: pain level controlled Vital Signs Assessment: post-procedure vital signs reviewed and stable Respiratory status: spontaneous breathing, nonlabored ventilation, respiratory function stable and patient connected to nasal cannula oxygen Cardiovascular status: blood pressure returned to baseline and stable Postop Assessment: no apparent nausea or vomiting Anesthetic complications: no   No notable events documented.   Last Vitals:  Vitals:   11/10/21 0928 11/10/21 0948  BP: (!) 120/55 132/78  Pulse: 70   Resp: 14   Temp:    SpO2: 99%     Last Pain:  Vitals:   11/10/21 0928  TempSrc:   PainSc: 0-No pain                 Arita Miss

## 2021-11-10 NOTE — Transfer of Care (Signed)
Immediate Anesthesia Transfer of Care Note  Patient: Jacqueline Alexander  Procedure(s) Performed: ESOPHAGOGASTRODUODENOSCOPY (EGD) COLONOSCOPY  Patient Location: PACU  Anesthesia Type:General  Level of Consciousness: awake and drowsy  Airway & Oxygen Therapy: Patient Spontanous Breathing  Post-op Assessment: Report given to RN and Post -op Vital signs reviewed and stable  Post vital signs: Reviewed and stable  Last Vitals:  Vitals Value Taken Time  BP 92/65 11/10/21 0921  Temp    Pulse 67 11/10/21 0921  Resp 16 11/10/21 0921  SpO2 98 % 11/10/21 0921    Last Pain:  Vitals:   11/10/21 0920  TempSrc:   PainSc: 0-No pain         Complications: No notable events documented.

## 2021-11-10 NOTE — Interval H&P Note (Signed)
History and Physical Interval Note: Preprocedure H&P from 11/10/21  was reviewed and there was no interval change after seeing and examining the patient.  Written consent was obtained from the patient after discussion of risks, benefits, and alternatives. Patient has consented to proceed with Esophagogastroduodenoscopy and Colonoscopy with possible intervention   11/10/2021 8:17 AM  Jacqueline Alexander  has presented today for surgery, with the diagnosis of IDA.  The various methods of treatment have been discussed with the patient and family. After consideration of risks, benefits and other options for treatment, the patient has consented to  Procedure(s) with comments: ESOPHAGOGASTRODUODENOSCOPY (EGD) (N/A) - DM COLONOSCOPY (N/A) as a surgical intervention.  The patient's history has been reviewed, patient examined, no change in status, stable for surgery.  I have reviewed the patient's chart and labs.  Questions were answered to the patient's satisfaction.     Annamaria Helling

## 2021-11-14 LAB — SURGICAL PATHOLOGY

## 2021-12-08 ENCOUNTER — Inpatient Hospital Stay: Payer: Medicare Other

## 2021-12-08 ENCOUNTER — Other Ambulatory Visit: Payer: Self-pay | Admitting: Genetic Counselor

## 2021-12-08 ENCOUNTER — Inpatient Hospital Stay: Payer: Medicare Other | Attending: Hematology and Oncology | Admitting: Genetic Counselor

## 2021-12-08 DIAGNOSIS — Z8041 Family history of malignant neoplasm of ovary: Secondary | ICD-10-CM

## 2021-12-08 DIAGNOSIS — Z800 Family history of malignant neoplasm of digestive organs: Secondary | ICD-10-CM | POA: Diagnosis not present

## 2021-12-08 DIAGNOSIS — Z808 Family history of malignant neoplasm of other organs or systems: Secondary | ICD-10-CM | POA: Diagnosis not present

## 2021-12-08 NOTE — Progress Notes (Signed)
REFERRING PROVIDER: Marvia Pickles, PA-C [place],  [place] [address]  PRIMARY PROVIDER:  Leonides Sake, MD  PRIMARY REASON FOR VISIT:  Encounter Diagnoses  Name Primary?   Family history of ovarian cancer    Family history of stomach cancer       HISTORY OF PRESENT ILLNESS:   Jacqueline Alexander, a 71 y.o. female, was seen for a [facility] cancer genetics consultation at the request of Rosanne Sack, PA-C due to a family history of ovarian cancer.  Jacqueline Alexander presents to clinic today to discuss the possibility of a hereditary predisposition to cancer, to discuss genetic testing, and to further clarify her future cancer risks, as well as potential cancer risks for family members.   Jacqueline Alexander is a 71 y.o. female with no personal history of cancer.  She has a history of esophageal dysplasia.    RISK FACTORS:  Mammogram within the last year: yes Number of breast biopsies: 0. Colonoscopy: yes;  most recent in 10/2021; ~2 hyperplastic polyps . Hysterectomy: yes at age 50 due to bladder/uterine prolapse Ovaries intact: no; BSO at age 30 Menarche was at age 63.  First live birth at age 82.  OCP use for approximately  2-3  years.  HRT use: patch for 6 months; in 50s Any excessive radiation exposure in the past: no Dermatology: as needed  Past Medical History:  Diagnosis Date   Anxiety    Arthritis    Barrett esophagus    Chronic diarrhea    Colon polyps    Depression    Diabetes mellitus without complication (HCC)    GERD (gastroesophageal reflux disease)    Hyperlipemia    Hypertension    Insomnia    Lumbar radiculitis    Mitral insufficiency    Restless leg    Restless leg syndrome    Sensory urge incontinence     Past Surgical History:  Procedure Laterality Date   ABDOMINAL HYSTERECTOMY     ABDOMINAL HYSTERECTOMY     CARPAL TUNNEL RELEASE Left    CHOLECYSTECTOMY     COLONOSCOPY N/A 11/10/2021   Procedure: COLONOSCOPY;  Surgeon: Annamaria Helling, DO;   Location: [facility] ENDOSCOPY;  Service: Gastroenterology;  Laterality: N/A;   ESOPHAGOGASTRODUODENOSCOPY N/A 11/10/2021   Procedure: ESOPHAGOGASTRODUODENOSCOPY (EGD);  Surgeon: Annamaria Helling, DO;  Location: [facility] ENDOSCOPY;  Service: Gastroenterology;  Laterality: N/A;  DM   ESOPHAGOGASTRODUODENOSCOPY (EGD) WITH PROPOFOL N/A 08/06/2019   Procedure: ESOPHAGOGASTRODUODENOSCOPY (EGD) WITH PROPOFOL;  Surgeon: Toledo, Benay Pike, MD;  Location: [facility] ENDOSCOPY;  Service: Gastroenterology;  Laterality: N/A;   HERNIA REPAIR     INCONTINENCE SURGERY     OOPHORECTOMY     TONSILLECTOMY       FAMILY HISTORY:  We obtained a detailed, 4-generation family history.  Significant diagnoses are listed below: Family History  Problem Relation Age of Onset   Skin cancer Mother        dx after 39   Skin cancer Sister        dx after 35   Stomach cancer Maternal Uncle        dx late 60s   Lung cancer Paternal Aunt    Ovarian cancer Paternal Aunt        dx after 30   Ovarian cancer Paternal Grandmother        d. late 68s   Pancreatitis Niece        d. 37    Jacqueline Alexander is unaware of previous  family history of genetic testing for hereditary cancer risks. There is no reported Ashkenazi Jewish ancestry. There is no known consanguinity.  GENETIC COUNSELING ASSESSMENT: Jacqueline Alexander is a 71 y.o. female with a family history of cancer which is somewhat suggestive of a hereditary cancer syndrome and predisposition to cancer given the presence of ovarian cancer in multiple generations in her paternal family. We, therefore, discussed and recommended the following at today's visit.   DISCUSSION: We discussed that 5 - 10% of cancer is hereditary, with most cases of hereditary ovarian cancer are associated with mutations in BRCA1/2.  There are other genes that can be associated with hereditary cancer syndromes.  We discussed that testing is beneficial for several reasons, including knowing about other cancer risks,  identifying potential screening and risk-reduction options that may be appropriate, and to understanding if other family members could be at risk for cancer and allowing them to undergo genetic testing.  We reviewed the characteristics, features and inheritance patterns of hereditary cancer syndromes. We also discussed genetic testing, including the appropriate family members to test, the process of testing, insurance coverage and turn-around-time for results. We discussed the implications of a negative, positive, carrier and/or variant of uncertain significant result. We discussed that negative results would be uninformative given that Jacqueline Alexander does not have a personal history of cancer. We recommended Jacqueline Alexander pursue genetic testing for a panel that contains genes associated with ovarian cancer, gastric cancer, and pancreatitis.  The CustomNext-Cancer +RNAinsight Panel offered by [facility] and includes sequencing and rearrangement analysis for the following 90 genes: AIP, ALK, APC, ATM, AXIN2, BAP1, BARD1, BLM, BMPR1A, BRCA1, BRCA2, BRIP1, CDC73, CDH1, CDK4, CDKN1B, CDKN2A, CHEK2, CTNNA1, DICER1, FANCC, FH, FLCN, GALNT12, KIF1B, LZTR1, MAX, MEN1, MET, MLH1, MRE11A, MSH2, MSH3, MSH6, MUTYH, NBN, NF1, NF2, NTHL1, PALB2, PHOX2B, PMS2, POT1, PRKAR1A, PTCH1, PTEN, RAD50, RAD51C, RAD51D, RB1, RECQL, RET, SDHA, SDHAF2, SDHB, SDHC, SDHD, SMAD4, SMARCA4, SMARCB1, SMARCE1, STK11, SUFU, TMEM127, TP53, TSC1, TSC2, VHL and XRCC2 (sequencing and deletion/duplication); CASR, CPA1, CTRC, EGFR, EGLN1, FAM175A, HOXB13, KIT, MITF, MLH3, PALLD, PDGFRA, POLD1, POLE, PRSS1, RINT1, RPS20, SPINK1 and TERT (sequencing only); EPCAM and GREM1 (deletion/duplication only). RNA data is routinely analyzed for use in variant interpretation for all genes.   Based on Jacqueline Alexander family history of ovarian cancer, she meets medical criteria for genetic testing. Despite that she meets criteria, she may still have an out of pocket cost. We  discussed that if her out of pocket cost for testing is over $100, the laboratory should contact them to discuss self-pay options and/or patient pay assistance programs.   We discussed that some people do not want to undergo genetic testing due to fear of genetic discrimination.  A federal law called the Genetic Information Non-Discrimination Act (GINA) of 2008 helps protect individuals against genetic discrimination based on their genetic test results.  It impacts both health insurance and employment.  With health insurance, it protects against increased premiums, being kicked off insurance or being forced to take a test in order to be insured.  For employment it protects against hiring, firing and promoting decisions based on genetic test results.  GINA does not apply to those in the [employer], those who work for companies with less than 15 employees, and new life insurance or long-term disability insurance policies.  Health status due to a cancer diagnosis is not protected under GINA.  PLAN: After considering the risks, benefits, and limitations, Jacqueline Alexander provided informed consent to pursue genetic testing and  the blood sample was sent to [facility] for analysis of the CustomNext-Cancer +RNAinsight Panel. Results should be available within approximately 3 weeks' time, at which point they will be disclosed by telephone to Jacqueline Alexander, as will any additional recommendations warranted by these results. Jacqueline Alexander will receive a summary of her genetic counseling visit and a copy of her results once available. This information will also be available in Epic.   Jacqueline Alexander questions were answered to her satisfaction today. Our contact information was provided should additional questions or concerns arise. Thank you for the referral and allowing [place] to share in the care of your patient.   Jacqueline Alexander M. Joette Catching, [place], [facility] Genetic Counselor [email or web address] (P) [phone]   The patient was seen for a  total of 30 minutes in face-to-face genetic counseling.  The patient was seen alone.  Drs. Lindi Adie and/or Burr Medico were available to discuss this case as needed.  _______________________________________________________________________ For Office Staff:  Number of people involved in session: 1 Was an Intern/ student involved with case: no

## 2021-12-09 LAB — GENETIC SCREENING ORDER

## 2021-12-11 ENCOUNTER — Encounter: Payer: Self-pay | Admitting: Genetic Counselor

## 2021-12-11 ENCOUNTER — Encounter: Payer: Self-pay | Admitting: Hematology and Oncology

## 2021-12-11 DIAGNOSIS — Z800 Family history of malignant neoplasm of digestive organs: Secondary | ICD-10-CM

## 2021-12-11 DIAGNOSIS — Z8041 Family history of malignant neoplasm of ovary: Secondary | ICD-10-CM

## 2021-12-11 HISTORY — DX: Family history of malignant neoplasm of ovary: Z80.41

## 2021-12-11 HISTORY — DX: Family history of malignant neoplasm of digestive organs: Z80.0

## 2021-12-26 ENCOUNTER — Other Ambulatory Visit: Payer: Self-pay

## 2021-12-27 ENCOUNTER — Other Ambulatory Visit: Payer: Self-pay | Admitting: Hematology and Oncology

## 2021-12-27 DIAGNOSIS — D509 Iron deficiency anemia, unspecified: Secondary | ICD-10-CM

## 2021-12-28 ENCOUNTER — Ambulatory Visit: Payer: Self-pay | Admitting: Genetic Counselor

## 2021-12-28 ENCOUNTER — Encounter: Payer: Self-pay | Admitting: Genetic Counselor

## 2021-12-28 ENCOUNTER — Telehealth: Payer: Self-pay | Admitting: Genetic Counselor

## 2021-12-28 ENCOUNTER — Inpatient Hospital Stay: Payer: Medicare Other | Attending: Hematology and Oncology | Admitting: Hematology and Oncology

## 2021-12-28 ENCOUNTER — Inpatient Hospital Stay: Payer: Medicare Other

## 2021-12-28 ENCOUNTER — Encounter: Payer: Self-pay | Admitting: Hematology and Oncology

## 2021-12-28 VITALS — BP 114/56 | HR 82 | Temp 97.7°F | Resp 20 | Ht 64.5 in | Wt 155.7 lb

## 2021-12-28 DIAGNOSIS — D509 Iron deficiency anemia, unspecified: Secondary | ICD-10-CM | POA: Insufficient documentation

## 2021-12-28 DIAGNOSIS — Z1379 Encounter for other screening for genetic and chromosomal anomalies: Secondary | ICD-10-CM

## 2021-12-28 DIAGNOSIS — R140 Abdominal distension (gaseous): Secondary | ICD-10-CM

## 2021-12-28 DIAGNOSIS — Z8041 Family history of malignant neoplasm of ovary: Secondary | ICD-10-CM

## 2021-12-28 DIAGNOSIS — Z800 Family history of malignant neoplasm of digestive organs: Secondary | ICD-10-CM

## 2021-12-28 DIAGNOSIS — C9000 Multiple myeloma not having achieved remission: Secondary | ICD-10-CM | POA: Diagnosis not present

## 2021-12-28 LAB — IRON AND TIBC
Iron: 69 ug/dL (ref 28–170)
Saturation Ratios: 21 % (ref 10.4–31.8)
TIBC: 325 ug/dL (ref 250–450)
UIBC: 256 ug/dL

## 2021-12-28 LAB — CBC AND DIFFERENTIAL
HCT: 40 (ref 36–46)
Hemoglobin: 13.1 (ref 12.0–16.0)
Neutrophils Absolute: 5.84
Platelets: 295 10*3/uL (ref 150–400)
WBC: 9.9

## 2021-12-28 LAB — CBC
MCV: 84 (ref 81–99)
RBC: 4.75 (ref 3.87–5.11)

## 2021-12-28 LAB — FERRITIN: Ferritin: 54 ng/mL (ref 11–307)

## 2021-12-28 NOTE — Telephone Encounter (Signed)
Revealed negative genetic testing and VUS in LZTR1 and RECQL.  Discussed that we do not know why there is cancer in the family. It could be sporadic/famillial, due to a change in a gene that she did not inherit, due to a different gene that we are not testing, or maybe our current technology may not be able to pick something up.  It will be important for her to keep in contact with genetics to keep up with whether additional testing may be needed.

## 2021-12-28 NOTE — Assessment & Plan Note (Addendum)
Iron deficiency anemia diagnosed in May.  She did not tolerate oral iron supplementation.  She received IV iron in the form of Feraheme in June.  She underwent EGD and colonoscopy at the end of June and no source of bleeding was found.  She had removal of stomach polyps which were benign.  She was found to have Barrett's esophagus.  She also had removal of colon polyps, which were hyperplastic polyps.  She states that she has been referred to an esophageal specialist who she is seeing in November.  She reports abdominal bloating, which I believe is likely due to resuming Synjardy.  I asked her to follow-up with her gastroenterologist regarding this.  Her hemoglobin has normalized.  Iron studies are pending from today.  We will plan to see her back in 3 months for repeat clinical assessment.

## 2021-12-28 NOTE — Progress Notes (Signed)
Jacqueline Alexander  [address] [place],  [place]  [address] [phone]  Clinic Day:  12/28/2021  Referring physician: Leonides Sake, MD  ASSESSMENT & PLAN:   Assessment & Plan: Iron deficiency anemia Iron deficiency anemia diagnosed in May.  She did not tolerate oral iron supplementation.  She received IV iron in the form of Feraheme in June.  She underwent EGD and colonoscopy at the end of June and no source of bleeding was found.  She had removal of stomach polyps which were benign.  She was found to have Barrett's esophagus.  She also had removal of colon polyps, which were hyperplastic polyps.  She states that she has been referred to an esophageal specialist who she is seeing in November.  She reports abdominal bloating, which I believe is likely due to resuming Synjardy.  I asked her to follow-up with her gastroenterologist regarding this.  Her hemoglobin has normalized.  Iron studies are pending from today.  We will plan to see her back in 3 months for repeat clinical assessment.    The patient understands the plans discussed today and is in agreement with them.  She knows to contact our office if she develops symptoms of recurrent anemia or other concerns prior to her next appointment.   I provided 15 minutes of face-to-face time during this encounter and > 50% was spent counseling as documented under my assessment and plan.    Jacqueline Pickles, PA-C  [facility] AT [facility] [address] [place] [address] Dept: [phone] Dept Fax: [phone]   Orders Placed This Encounter  Procedures   CBC and differential    This external order was created through the Results Console.   CBC    This external order was created through the Results Console.   CBC    This order was created through External Result Entry      CHIEF COMPLAINT:  CC: Iron deficiency anemia  Current Treatment: IV  iron as needed  HISTORY OF PRESENT ILLNESS:  Jacqueline Alexander is a 71 y.o. female with iron deficiency anemia who I began seeing in June.  On May 31, her hemoglobin was 9.7 with an MCV of 72 and ferritin 5 ug/mL.  White blood count and platelets were normal.  The patient had evidence of iron deficiency dating back to June 2022, at which time her hemoglobin was 10.7 with an MCV of 74, and ferritin 5 ug/mL.  She does not tolerate oral iron due to constipation, so was referred to [place] for IV iron.  She has a history of Barrett's esophagus and her last EGD was in March 2021 at [facility] in [place].  This revealed persistent Barrett's disease.  She continues on pantoprazole 20 mg daily.  Her last colonoscopy was in January 2018 at [facility].  This was normal, so follow-up in 10 years was recommended.  The patient gave a history of B12 deficiency and was not taking B12 supplement.  B12 level was well within normal limits at 749 in June.  The patient has never had gastric surgery.  She reported chronic diarrhea up to 4 times daily, but the stool is sometimes formed.  She noted her stools appeared more narrow recently.  She denied melena or hematochezia.  She reports intermittent nausea without vomiting.  She has a history of small intestinal bacterial overgrowth and wonders if she may have that again.  She underwent total  hysterectomy and bilateral salpingo-oophorectomy with bladder tacking at age 55 for bladder and uterine prolapse and denied vaginal bleeding. She is on aspirin 81 mg daily.  She denied heavy alcohol use, but does drink socially.  INTERVAL HISTORY:  Jacqueline Alexander is here today for repeat clinical assessment after receiving IV Feraheme.  She states her energy level has improved.  Her main complaint today is abdominal bloating..  She denies abdominal pain.  She states she has been off her Synjardy for about 3 weeks and started back on this last week.  She states she has occasional loose stool and  now reports foul-smelling flatulence.  She continues to deny any of form of blood loss.  She denies fevers or chills. She denies pain. Her appetite is good. Her weight has increased 5 pounds over last 2 months .  REVIEW OF SYSTEMS:  Review of Systems  Constitutional:  Negative for appetite change, chills, fatigue, fever and unexpected weight change.  HENT:   Negative for lump/mass, mouth sores and sore throat.   Respiratory:  Negative for cough and shortness of breath.   Cardiovascular:  Negative for chest pain and leg swelling.  Gastrointestinal:  Positive for abdominal distention and diarrhea. Negative for abdominal pain, blood in stool, constipation, nausea and vomiting.  Endocrine: Negative for hot flashes.  Genitourinary:  Negative for difficulty urinating, dysuria, frequency and hematuria.   Musculoskeletal:  Negative for arthralgias, back pain and myalgias.  Skin:  Negative for rash.  Neurological:  Negative for dizziness and headaches.  Hematological:  Negative for adenopathy. Does not bruise/bleed easily.  Psychiatric/Behavioral:  Negative for depression and sleep disturbance. The patient is not nervous/anxious.      VITALS:  Blood pressure (!) 114/56, pulse 82, temperature 97.7 F (36.5 C), temperature source Oral, resp. rate 20, height 5' 4.5" (1.638 m), weight 155 lb 11.2 oz (70.6 kg), SpO2 96 %.  Wt Readings from Last 3 Encounters:  12/28/21 155 lb 11.2 oz (70.6 kg)  11/10/21 150 lb (68 kg)  11/08/21 152 lb 12 oz (69.3 kg)    Body mass index is 26.31 kg/m.  Performance status (ECOG): 1 - Symptomatic but completely ambulatory  PHYSICAL EXAM:  Physical Exam Vitals and nursing note reviewed.  Constitutional:      General: She is not in acute distress.    Appearance: Normal appearance.  HENT:     Head: Normocephalic and atraumatic.     Mouth/Throat:     Mouth: Mucous membranes are moist.     Pharynx: Oropharynx is clear. No oropharyngeal exudate or posterior  oropharyngeal erythema.  Eyes:     General: No scleral icterus.    Extraocular Movements: Extraocular movements intact.     Conjunctiva/sclera: Conjunctivae normal.     Pupils: Pupils are equal, round, and reactive to light.  Cardiovascular:     Rate and Rhythm: Normal rate and regular rhythm.     Heart sounds: Normal heart sounds. No murmur heard.    No friction rub. No gallop.  Pulmonary:     Effort: Pulmonary effort is normal.     Breath sounds: Normal breath sounds. No wheezing, rhonchi or rales.  Abdominal:     General: Bowel sounds are decreased. There is no distension.     Palpations: Abdomen is soft. There is no hepatomegaly, splenomegaly or mass.     Tenderness: There is no abdominal tenderness.  Musculoskeletal:        General: Normal range of motion.     Cervical back: Normal  range of motion and neck supple. No tenderness.     Right lower leg: No edema.     Left lower leg: No edema.  Lymphadenopathy:     Cervical: No cervical adenopathy.     Upper Body:     Right upper body: No supraclavicular or axillary adenopathy.     Left upper body: No supraclavicular or axillary adenopathy.     Lower Body: No right inguinal adenopathy. No left inguinal adenopathy.  Skin:    General: Skin is warm and dry.     Coloration: Skin is not jaundiced.     Findings: No rash.  Neurological:     Mental Status: She is alert and oriented to person, place, and time.     Cranial Nerves: No cranial nerve deficit.  Psychiatric:        Mood and Affect: Mood normal.        Behavior: Behavior normal.        Thought Content: Thought content normal.    LABS:      Latest Ref Rng & Units 12/28/2021   12:00 AM 10/28/2021   12:00 AM  CBC  WBC  9.9     9.9      Hemoglobin 12.0 - 16.0 13.1     10.0      Hematocrit 36 - 46 40     34      Platelets 150 - 400 K/uL 295     392         This result is from an external source.      Latest Ref Rng & Units 10/28/2021   12:00 AM  CMP  BUN 4 - 21 16       Creatinine 0.5 - 1.1 0.6      Sodium 137 - 147 140      Potassium 3.5 - 5.1 mEq/L 4.1      Chloride 99 - 108 103      CO2 13 - 22 27      Calcium 8.7 - 10.7 9.3      Alkaline Phos 25 - 125 64      AST 13 - 35 27      ALT 7 - 35 U/L 14         This result is from an external source.     No results found for: "CEA1", "CEA" / No results found for: "CEA1", "CEA" No results found for: "PSA1" No results found for: "NKN397" No results found for: "CAN125"  No results found for: "TOTALPROTELP", "ALBUMINELP", "A1GS", "A2GS", "BETS", "BETA2SER", "GAMS", "MSPIKE", "SPEI" Lab Results  Component Value Date   TIBC 509 (H) 10/28/2021   FERRITIN 3 (L) 10/28/2021   IRONPCTSAT 6 (L) 10/28/2021   No results found for: "LDH"  STUDIES:  No results found.    HISTORY:   Past Medical History:  Diagnosis Date   Anxiety    Arthritis    Barrett esophagus    Chronic diarrhea    Colon polyps    Depression    Diabetes mellitus without complication ([place])    Family history of ovarian cancer 12/11/2021   Family history of stomach cancer 12/11/2021   GERD (gastroesophageal reflux disease)    Hyperlipemia    Hypertension    Insomnia    Lumbar radiculitis    Mitral insufficiency    Restless leg    Restless leg syndrome    Sensory urge incontinence     Past Surgical History:  Procedure Laterality  Date   ABDOMINAL HYSTERECTOMY     ABDOMINAL HYSTERECTOMY     CARPAL TUNNEL RELEASE Left    CHOLECYSTECTOMY     COLONOSCOPY N/A 11/10/2021   Procedure: COLONOSCOPY;  Surgeon: Annamaria Helling, DO;  Location: [facility] ENDOSCOPY;  Service: Gastroenterology;  Laterality: N/A;   ESOPHAGOGASTRODUODENOSCOPY N/A 11/10/2021   Procedure: ESOPHAGOGASTRODUODENOSCOPY (EGD);  Surgeon: Annamaria Helling, DO;  Location: [facility] ENDOSCOPY;  Service: Gastroenterology;  Laterality: N/A;  DM   ESOPHAGOGASTRODUODENOSCOPY (EGD) WITH PROPOFOL N/A 08/06/2019   Procedure: ESOPHAGOGASTRODUODENOSCOPY (EGD) WITH  PROPOFOL;  Surgeon: Toledo, Benay Pike, MD;  Location: [facility] ENDOSCOPY;  Service: Gastroenterology;  Laterality: N/A;   HERNIA REPAIR     INCONTINENCE SURGERY     OOPHORECTOMY     TONSILLECTOMY      Family History  Problem Relation Age of Onset   Hypertension Mother    Arthritis Mother    Heart disease Mother    Skin cancer Mother        dx after 69   Hypertension Father    Tuberculosis Father    Emphysema Father    Heart attack Father    Hyperlipidemia Father    Hypertension Sister    Skin cancer Sister        dx after 61   Stomach cancer Maternal Uncle        dx late 64s   Lung cancer Paternal Aunt    Ovarian cancer Paternal Aunt        dx after 27   Ovarian cancer Paternal Grandmother        d. late 28s   Pancreatitis Niece        d. 64   Breast cancer Neg Hx     Social History:  reports that she quit smoking about 11 years ago. Her smoking use included cigarettes. She has never used smokeless tobacco. She reports that she does not currently use alcohol. She reports that she does not currently use drugs.The patient is alone today.  Allergies:  Allergies  Allergen Reactions   Morphine Nausea And Vomiting and Nausea Only   Ace Inhibitors    Ezetimibe     Current Medications: Current Outpatient Medications  Medication Sig Dispense Refill   acidophilus (RISAQUAD) CAPS capsule Take by mouth daily.     albuterol (VENTOLIN HFA) 108 (90 Base) MCG/ACT inhaler SMARTSIG:2 Puff(s) By Mouth Every 4-6 Hours PRN     amLODipine (NORVASC) 5 MG tablet Take 5 mg by mouth daily.     aspirin EC 81 MG tablet Take 81 mg by mouth daily. Swallow whole.     carvedilol (COREG) 25 MG tablet Take by mouth.     cholecalciferol (VITAMIN D3) 10 MCG (400 UNIT) TABS tablet Take 1,000 Units by mouth.     clobetasol ointment (TEMOVATE) 0.05 % APPLY DAILY TO THE SKIN AROUND THE VAGINA AND RECTUM FOR 12 WEEKS     Cysteamine Bitartrate (PROCYSBI) 300 MG PACK lancets 30 gauge     estradiol (ESTRACE)  0.1 MG/GM vaginal cream Place 1 Applicatorful vaginally at bedtime.     FLUoxetine (PROZAC) 20 MG capsule      LORazepam (ATIVAN) 0.5 MG tablet TK 1 T PO ONCE DAILY PRN  0   metFORMIN (GLUCOPHAGE-XR) 750 MG 24 hr tablet metformin ER 750 mg tablet,extended release 24 hr  TAKE 1 TABLET BY MOUTH TWICE DAILY WITH FOOD FOR DIABETES     Omeprazole 20 MG TBEC      pantoprazole (PROTONIX) 40 MG tablet  pantoprazole 40 mg tablet,delayed release  TAKE 1 TABLET BY MOUTH DAILY FOR STOMACH ACID OR REFLUX     rosuvastatin (CRESTOR) 5 MG tablet Take 5 mg by mouth daily.     SYNJARDY 09-998 MG TABS Take 1 tablet by mouth every morning.     vitamin B-12 (CYANOCOBALAMIN) 250 MCG tablet Take 2,500 mcg by mouth daily.     No current facility-administered medications for this visit.

## 2021-12-28 NOTE — Progress Notes (Signed)
HPI:   [name] was previously seen in the [place] clinic due to a family history of ovarian cancer and concerns regarding a hereditary predisposition to cancer. Please refer to our prior cancer genetics clinic note for more information regarding our discussion, assessment and recommendations, at the time. [name] recent genetic test results were disclosed to her, as were recommendations warranted by these results. These results and recommendations are discussed in more detail below.  CANCER HISTORY:    [name] is a 71 y.o. female with no personal history of cancer.  She has a history of esophageal dysplasia.     FAMILY HISTORY:  We obtained a detailed, 4-generation family history.  Significant diagnoses are listed below:      Family History  Problem Relation Age of Onset   Skin cancer Mother          dx after 12   Skin cancer Sister          dx after 46   Stomach cancer Maternal Uncle          dx late 60s   Lung cancer Paternal Aunt     Ovarian cancer Paternal Aunt          dx after 23   Ovarian cancer Paternal Grandmother          d. late 74s   Pancreatitis Niece          d. 55     [name] is unaware of previous family history of genetic testing for hereditary cancer risks. There is no reported Ashkenazi Jewish ancestry. There is no known consanguinity.  GENETIC TEST RESULTS:  The [facility] CustomNext-Cancer +RNAinsight Panel found no pathogenic mutations.   The CustomNext-Cancer +RNAinsight Panel offered by [facility] and includes sequencing and rearrangement analysis for the following 90 genes: AIP, ALK, APC, ATM, AXIN2, BAP1, BARD1, BLM, BMPR1A, BRCA1, BRCA2, BRIP1, CDC73, CDH1, CDK4, CDKN1B, CDKN2A, CHEK2, CTNNA1, DICER1, FANCC, FH, FLCN, GALNT12, KIF1B, LZTR1, MAX, MEN1, MET, MLH1, MRE11A, MSH2, MSH3, MSH6, MUTYH, NBN, NF1, NF2, NTHL1, PALB2, PHOX2B, PMS2, POT1, PRKAR1A, PTCH1, PTEN, RAD50, RAD51C, RAD51D, RB1, RECQL, RET, SDHA, SDHAF2, SDHB, SDHC,  SDHD, SMAD4, SMARCA4, SMARCB1, SMARCE1, STK11, SUFU, TMEM127, TP53, TSC1, TSC2, VHL and XRCC2 (sequencing and deletion/duplication); CASR, CPA1, CTRC, EGFR, EGLN1, FAM175A, HOXB13, KIT, MITF, MLH3, PALLD, PDGFRA, POLD1, POLE, PRSS1, RINT1, RPS20, SPINK1 and TERT (sequencing only); EPCAM and GREM1 (deletion/duplication only). RNA data is routinely analyzed for use in variant interpretation for all genes. .   The test report has been scanned into EPIC and is located under the Molecular Pathology section of the Results Review tab.  A portion of the result report is included below for reference. Genetic testing reported out on December 27, 2021.       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  At this time, it is unknown if these variants are associated with increased cancer risk or if they are normal findings, but most variants such as these get reclassified to being inconsequential. They should not be used to make medical management decisions. With time, we suspect the lab will determine the significance of these variants, if any. If we do learn more about them, we will try to contact [name] to discuss it further. However, it is important to stay in touch with [place] periodically and keep the  address and phone number up to date.  Even though a pathogenic variant was not identified, possible explanations for the cancer in the family may include: There may be no hereditary risk for cancer in the family. The cancers in [name] family may be sporadic/familial or due to other genetic and environmental factors. There may be a gene mutation in one of these genes that current testing methods cannot detect but that chance is small. There could be another gene that has not yet been discovered, or that we have not yet tested, that is responsible for the cancer  diagnoses in the family.  It is also possible there is a hereditary cause for the cancer in the family that [name] did not inherit.  Therefore, it is important to remain in touch with cancer genetics in the future so that we can continue to offer [name] the most up to date genetic testing.    ADDITIONAL GENETIC TESTING:  We discussed with [name] that her genetic testing was fairly extensive.  If there are additional relevant genes identified to increase cancer risk that can be analyzed in the future, we would be happy to discuss and coordinate this testing at that time.     CANCER SCREENING RECOMMENDATIONS:  [name] test result is considered negative (normal).  This means that we have not identified a hereditary cause for her family history of cancer at this time.   An individual's cancer risk and medical management are not determined by genetic test results alone. Overall cancer risk assessment incorporates additional factors, including personal medical history, family history, and any available genetic information that may result in a personalized plan for cancer prevention and surveillance. Therefore, it is recommended she continue to follow the cancer management and screening guidelines provided by her oncology and primary healthcare provider.  RECOMMENDATIONS FOR FAMILY MEMBERS:   Since she did not inherit a identifiable mutation in a cancer predisposition gene included on this panel, her children could not have inherited a known mutation from her in one of these genes. Individuals in this family might be at some increased risk of developing cancer, over the general population risk, due to the family history of cancer.  Individuals in the family should notify their providers of the family history of cancer. We recommend women in this family have a yearly mammogram beginning at age 46, or 7 years younger than the earliest onset of cancer, an annual clinical breast exam, and perform  monthly breast self-exams.   Other members of the family may still carry a pathogenic variant in one of these genes that [name] did not inherit. Based on the paternal family history ovarian cancer, we recommend her paternal relatives (especially paternal aunts/uncles if available) have genetic counseling and testing. [name] will let us know if we can be of any assistance in coordinating genetic counseling and/or testing for these family members.   We do not recommend familial testing for the LZTR1 or RECQL variant of uncertain significance (VUS).  FOLLOW-UP:  Lastly, we discussed with [name] that cancer genetics is a rapidly advancing field and it is possible that new genetic tests will be appropriate for her and/or her family members in the future. We encouraged her to remain in contact with cancer genetics on an annual basis so we can update her personal and family histories and let her know of advances in cancer genetics that may benefit this family.   Our contact number was provided. [name]  questions were answered to her satisfaction, and she knows she is welcome to call us at anytime with additional questions or concerns.   Jacqueline Alexander M. Jacqueline Alexander, [place], [facility] Genetic Counselor [email or web address] (P) [phone]

## 2022-01-23 ENCOUNTER — Other Ambulatory Visit: Payer: Self-pay | Admitting: Family Medicine

## 2022-01-23 DIAGNOSIS — E1169 Type 2 diabetes mellitus with other specified complication: Secondary | ICD-10-CM | POA: Diagnosis not present

## 2022-01-23 DIAGNOSIS — E785 Hyperlipidemia, unspecified: Secondary | ICD-10-CM | POA: Diagnosis not present

## 2022-01-23 DIAGNOSIS — E7800 Pure hypercholesterolemia, unspecified: Secondary | ICD-10-CM | POA: Diagnosis not present

## 2022-01-23 DIAGNOSIS — E2839 Other primary ovarian failure: Secondary | ICD-10-CM

## 2022-01-23 DIAGNOSIS — D509 Iron deficiency anemia, unspecified: Secondary | ICD-10-CM | POA: Diagnosis not present

## 2022-01-23 DIAGNOSIS — Z1231 Encounter for screening mammogram for malignant neoplasm of breast: Secondary | ICD-10-CM | POA: Diagnosis not present

## 2022-01-23 DIAGNOSIS — I10 Essential (primary) hypertension: Secondary | ICD-10-CM | POA: Diagnosis not present

## 2022-03-06 DIAGNOSIS — H43813 Vitreous degeneration, bilateral: Secondary | ICD-10-CM | POA: Diagnosis not present

## 2022-03-06 DIAGNOSIS — H2513 Age-related nuclear cataract, bilateral: Secondary | ICD-10-CM | POA: Diagnosis not present

## 2022-03-06 DIAGNOSIS — E119 Type 2 diabetes mellitus without complications: Secondary | ICD-10-CM | POA: Diagnosis not present

## 2022-03-28 ENCOUNTER — Other Ambulatory Visit: Payer: Self-pay | Admitting: Hematology and Oncology

## 2022-03-28 DIAGNOSIS — D509 Iron deficiency anemia, unspecified: Secondary | ICD-10-CM

## 2022-03-29 NOTE — Assessment & Plan Note (Addendum)
Iron deficiency anemia diagnosed in May.  She did not tolerate oral iron supplementation.  She received IV iron in the form of Feraheme in June.  She underwent EGD and colonoscopy at the end of June and no source of bleeding was found.  She had removal of stomach polyps, which were benign.  She was found to have Barrett's esophagus.  She also had removal of colon polyps, which were hyperplastic polyps. She states that she is seeing the esophageal specialist this month.  Her hemoglobin and iron parameters remain normal.  Her B12 is elevated, so I will have her stop her B12 supplement.  I will plan to see her back in 3 months for repeat clinical assessment.

## 2022-03-29 NOTE — Progress Notes (Signed)
[place]  [address] [place],  Jacqueline Alexander  [address] [phone]  Clinic Day:  03/30/2022  Referring physician: Leonides Sake, MD  ASSESSMENT & PLAN:   Assessment & Plan: Iron deficiency anemia Iron deficiency anemia diagnosed in May.  She did not tolerate oral iron supplementation.  She received IV iron in the form of Feraheme in June.  She underwent EGD and colonoscopy at the end of June and no source of bleeding was found.  She had removal of stomach polyps, which were benign.  She was found to have Barrett's esophagus.  She also had removal of colon polyps, which were hyperplastic polyps. She states that she is seeing the esophageal specialist this month.  Her hemoglobin and iron parameters remain normal.  Her B12 is elevated, so I will have her stop her B12 supplement.  I will plan to see her back in 3 months for repeat clinical assessment.   The patient understands the plans discussed today and is in agreement with them.  She knows to contact our office if she develops concerns prior to her next appointment.      Marvia Pickles, PA-C  [facility] [address] [place] [address] Dept: [phone] Dept Fax: [phone]   Orders Placed This Encounter  Procedures   Vitamin B12    Standing Status:   Future    Number of Occurrences:   1    Standing Expiration Date:   03/31/2023      CHIEF COMPLAINT:  CC: Iron deficiency  Current Treatment:  Observation  HISTORY OF PRESENT ILLNESS:  Jacqueline Alexander is a 71 y.o. female with iron deficiency anemia who I began seeing in June.  On May 31, her hemoglobin was 9.7 with an MCV of 72 and ferritin 5 ug/mL.  White blood count and platelets were normal.  The patient had evidence of iron deficiency dating back to June 2022, at which time her hemoglobin was 10.7 with an MCV of 74, and ferritin 5 ug/mL.  She does not tolerate oral  iron due to constipation, so was referred to [place] for IV iron.  She has a history of Barrett's esophagus and her last EGD was in March 2021 at [facility] in [place].  This revealed persistent Barrett's disease.  She continues on pantoprazole 20 mg daily.  Her last colonoscopy was in January 2018 at [facility].  This was normal, so follow-up in 10 years was recommended.  The patient gave a history of B12 deficiency and was not taking B12 supplement.  B12 level was well within normal limits at 749 in June.  The patient has never had gastric surgery.  She reported chronic diarrhea up to 4 times daily, but the stool is sometimes formed.  She noted her stools appeared more narrow recently.  She denied melena or hematochezia.  She reports intermittent nausea without vomiting.  She has a history of small intestinal bacterial overgrowth and wonders if she may have that again.  She underwent total hysterectomy and bilateral salpingo-oophorectomy with bladder tacking at age 59 for bladder and uterine prolapse and denied vaginal bleeding. She is on aspirin 81 mg daily.  She denied heavy alcohol use, but does drink socially.   INTERVAL HISTORY:  [name] is here today for repeat clinical assessment. She reports mild fatigue, but denies progressive fatigue concerning for recurrent anemia.  She is able to do her normal daily activities.  She reports chronic diarrhea.  She denies fevers or chills. She denies pain. Her appetite is good. Her weight has increased 2 pounds over last 3 months .  She continues oral B12 supplement, but feels she may need an injection.  I added a B12 level today.  She states she is seeing the esophageal specialist this month.  She is also scheduled for mammogram and bone density scan this month.  REVIEW OF SYSTEMS:  Review of Systems  Constitutional:  Positive for fatigue. Negative for appetite change, chills, fever and unexpected weight change.  HENT:   Negative for lump/mass, mouth  sores and sore throat.   Respiratory:  Negative for cough and shortness of breath.   Cardiovascular:  Negative for chest pain and leg swelling.  Gastrointestinal:  Positive for diarrhea. Negative for abdominal pain, blood in stool, constipation, nausea and vomiting.  Endocrine: Negative for hot flashes.  Genitourinary:  Negative for difficulty urinating, dysuria, frequency and hematuria.   Musculoskeletal:  Negative for arthralgias, back pain and myalgias.  Skin:  Negative for rash.  Neurological:  Negative for dizziness and headaches.  Hematological:  Negative for adenopathy. Does not bruise/bleed easily.  Psychiatric/Behavioral:  Negative for depression and sleep disturbance. The patient is not nervous/anxious.      VITALS:  Blood pressure (!) 113/56, pulse 78, temperature 97.9 F (36.6 C), temperature source Oral, resp. rate 20, height 5' 4.5" (1.638 m), weight 157 lb 14.4 oz (71.6 kg), SpO2 94 %.  Wt Readings from Last 3 Encounters:  03/30/22 157 lb 14.4 oz (71.6 kg)  12/28/21 155 lb 11.2 oz (70.6 kg)  11/10/21 150 lb (68 kg)    Body mass index is 26.68 kg/m.  Performance status (ECOG): 1 - Symptomatic but completely ambulatory  PHYSICAL EXAM:  Physical Exam Vitals and nursing note reviewed.  Constitutional:      General: She is not in acute distress.    Appearance: Normal appearance.  HENT:     Head: Normocephalic and atraumatic.     Mouth/Throat:     Mouth: Mucous membranes are moist.     Pharynx: Oropharynx is clear. No oropharyngeal exudate or posterior oropharyngeal erythema.  Eyes:     General: No scleral icterus.    Extraocular Movements: Extraocular movements intact.     Conjunctiva/sclera: Conjunctivae normal.     Pupils: Pupils are equal, round, and reactive to light.  Cardiovascular:     Rate and Rhythm: Normal rate and regular rhythm.     Heart sounds: Normal heart sounds. No murmur heard.    No friction rub. No gallop.  Pulmonary:     Effort: Pulmonary  effort is normal.     Breath sounds: Normal breath sounds. No wheezing, rhonchi or rales.  Abdominal:     General: There is no distension.     Palpations: Abdomen is soft. There is no hepatomegaly, splenomegaly or mass.     Tenderness: There is no abdominal tenderness.  Musculoskeletal:        General: Normal range of motion.     Cervical back: Normal range of motion and neck supple. No tenderness.     Right lower leg: No edema.     Left lower leg: No edema.  Lymphadenopathy:     Cervical: No cervical adenopathy.     Upper Body:     Right upper body: No supraclavicular or axillary adenopathy.     Left upper body: No supraclavicular or axillary adenopathy.  Skin:    General: Skin is  warm and dry.     Coloration: Skin is not jaundiced.     Findings: No rash.  Neurological:     Mental Status: She is alert and oriented to person, place, and time.     Cranial Nerves: No cranial nerve deficit.  Psychiatric:        Mood and Affect: Mood normal.        Behavior: Behavior normal.        Thought Content: Thought content normal.    LABS:      Latest Ref Rng & Units 03/30/2022   11:04 AM 12/28/2021   12:00 AM 10/28/2021   12:00 AM  CBC  WBC 4.0 - 10.5 K/uL 9.8  9.9     9.9      Hemoglobin 12.0 - 15.0 g/dL 13.4  13.1     10.0      Hematocrit 36.0 - 46.0 % 42.1  40     34      Platelets 150 - 400 K/uL 326  295     392         This result is from an external source.      Latest Ref Rng & Units 10/28/2021   12:00 AM  CMP  BUN 4 - 21 16      Creatinine 0.5 - 1.1 0.6      Sodium 137 - 147 140      Potassium 3.5 - 5.1 mEq/L 4.1      Chloride 99 - 108 103      CO2 13 - 22 27      Calcium 8.7 - 10.7 9.3      Alkaline Phos 25 - 125 64      AST 13 - 35 27      ALT 7 - 35 U/L 14         This result is from an external source.     No results found for: "CEA1", "CEA" / No results found for: "CEA1", "CEA" No results found for: "PSA1" No results found for: "XNA355" No results found  for: "CAN125"  No results found for: "TOTALPROTELP", "ALBUMINELP", "A1GS", "A2GS", "BETS", "BETA2SER", "GAMS", "MSPIKE", "SPEI" Lab Results  Component Value Date   TIBC 383 03/30/2022   TIBC 325 12/28/2021   TIBC 509 (H) 10/28/2021   FERRITIN 49 03/30/2022   FERRITIN 54 12/28/2021   FERRITIN 3 (L) 10/28/2021   IRONPCTSAT 31 03/30/2022   IRONPCTSAT 21 12/28/2021   IRONPCTSAT 6 (L) 10/28/2021   No results found for: "LDH"  STUDIES:  No results found.    HISTORY:   Past Medical History:  Diagnosis Date   Anxiety    Arthritis    Barrett esophagus    Chronic diarrhea    Colon polyps    Depression    Diabetes mellitus without complication ([place])    Family history of ovarian cancer 12/11/2021   Family history of stomach cancer 12/11/2021   GERD (gastroesophageal reflux disease)    Hyperlipemia    Hypertension    Insomnia    Lumbar radiculitis    Mitral insufficiency    Restless leg    Restless leg syndrome    Sensory urge incontinence     Past Surgical History:  Procedure Laterality Date   ABDOMINAL HYSTERECTOMY     ABDOMINAL HYSTERECTOMY     CARPAL TUNNEL RELEASE Left    CHOLECYSTECTOMY     COLONOSCOPY N/A 11/10/2021   Procedure: COLONOSCOPY;  Surgeon: Annamaria Helling, DO;  Location: [facility] ENDOSCOPY;  Service: Gastroenterology;  Laterality: N/A;   ESOPHAGOGASTRODUODENOSCOPY N/A 11/10/2021   Procedure: ESOPHAGOGASTRODUODENOSCOPY (EGD);  Surgeon: Annamaria Helling, DO;  Location[facility];  Service: Gastroenterology;  Laterality: N/A;  DM   ESOPHAGOGASTRODUODENOSCOPY (EGD) WITH PROPOFOL N/A 08/06/2019   Procedure: ESOPHAGOGASTRODUODENOSCOPY (EGD) WITH PROPOFOL;  Surgeon: Toledo, Benay Pike, MD;  Location: [facility] ENDOSCOPY;  Service: Gastroenterology;  Laterality: N/A;   HERNIA REPAIR     INCONTINENCE SURGERY     OOPHORECTOMY     TONSILLECTOMY      Family History  Problem Relation Age of Onset   Hypertension Mother    Arthritis Mother    Heart disease  Mother    Skin cancer Mother        dx after 38   Hypertension Father    Tuberculosis Father    Emphysema Father    Heart attack Father    Hyperlipidemia Father    Hypertension Sister    Skin cancer Sister        dx after 14   Stomach cancer Maternal Uncle        dx late 66s   Lung cancer Paternal Aunt    Ovarian cancer Paternal Aunt        dx after 22   Ovarian cancer Paternal Grandmother        d. late 34s   Pancreatitis Niece        d. 37   Breast cancer Neg Hx     Social History:  reports that she quit smoking about 11 years ago. Her smoking use included cigarettes. She has never used smokeless tobacco. She reports that she does not currently use alcohol. She reports that she does not currently use drugs.The patient is alone today.  Allergies:  Allergies  Allergen Reactions   Morphine Nausea And Vomiting and Nausea Only   Ace Inhibitors    Ezetimibe     Current Medications: Current Outpatient Medications  Medication Sig Dispense Refill   ONETOUCH VERIO test strip daily.     acidophilus (RISAQUAD) CAPS capsule Take by mouth daily.     albuterol (VENTOLIN HFA) 108 (90 Base) MCG/ACT inhaler SMARTSIG:2 Puff(s) By Mouth Every 4-6 Hours PRN     amLODipine (NORVASC) 5 MG tablet Take 5 mg by mouth daily.     aspirin EC 81 MG tablet Take 81 mg by mouth daily. Swallow whole.     carvedilol (COREG) 25 MG tablet Take by mouth.     cholecalciferol (VITAMIN D3) 10 MCG (400 UNIT) TABS tablet Take 1,000 Units by mouth.     clobetasol ointment (TEMOVATE) 0.05 % APPLY DAILY TO THE SKIN AROUND THE VAGINA AND RECTUM FOR 12 WEEKS     Cysteamine Bitartrate (PROCYSBI) 300 MG PACK lancets 30 gauge     estradiol (ESTRACE) 0.1 MG/GM vaginal cream Place 1 Applicatorful vaginally at bedtime.     FLUoxetine (PROZAC) 20 MG capsule      LORazepam (ATIVAN) 0.5 MG tablet TK 1 T PO ONCE DAILY PRN  0   metFORMIN (GLUCOPHAGE-XR) 750 MG 24 hr tablet Patient reports taking at night only     Omeprazole  20 MG TBEC      pantoprazole (PROTONIX) 40 MG tablet pantoprazole 40 mg tablet,delayed release  TAKE 1 TABLET BY MOUTH DAILY FOR STOMACH ACID OR REFLUX     polyethylene glycol-electrolytes (NULYTELY) 420 g solution Take by mouth as directed. (Patient not taking: Reported on 03/30/2022)     rosuvastatin (CRESTOR) 5 MG  tablet Take 5 mg by mouth daily.     SYNJARDY 09-998 MG TABS Take 1 tablet by mouth every morning.     vitamin B-12 (CYANOCOBALAMIN) 250 MCG tablet Take 2,500 mcg by mouth daily.     No current facility-administered medications for this visit.

## 2022-03-30 ENCOUNTER — Inpatient Hospital Stay: Payer: Medicare Other

## 2022-03-30 ENCOUNTER — Inpatient Hospital Stay: Payer: Medicare Other | Attending: Hematology and Oncology | Admitting: Hematology and Oncology

## 2022-03-30 ENCOUNTER — Encounter: Payer: Self-pay | Admitting: Hematology and Oncology

## 2022-03-30 VITALS — BP 113/56 | HR 78 | Temp 97.9°F | Resp 20 | Ht 64.5 in | Wt 157.9 lb

## 2022-03-30 DIAGNOSIS — D509 Iron deficiency anemia, unspecified: Secondary | ICD-10-CM | POA: Insufficient documentation

## 2022-03-30 LAB — CBC WITH DIFFERENTIAL (CANCER CENTER ONLY)
Abs Immature Granulocytes: 0.03 10*3/uL (ref 0.00–0.07)
Basophils Absolute: 0.1 10*3/uL (ref 0.0–0.1)
Basophils Relative: 1 %
Eosinophils Absolute: 0.3 10*3/uL (ref 0.0–0.5)
Eosinophils Relative: 3 %
HCT: 42.1 % (ref 36.0–46.0)
Hemoglobin: 13.4 g/dL (ref 12.0–15.0)
Immature Granulocytes: 0 %
Lymphocytes Relative: 29 %
Lymphs Abs: 2.9 10*3/uL (ref 0.7–4.0)
MCH: 29.1 pg (ref 26.0–34.0)
MCHC: 31.8 g/dL (ref 30.0–36.0)
MCV: 91.5 fL (ref 80.0–100.0)
Monocytes Absolute: 0.9 10*3/uL (ref 0.1–1.0)
Monocytes Relative: 9 %
Neutro Abs: 5.6 10*3/uL (ref 1.7–7.7)
Neutrophils Relative %: 58 %
Platelet Count: 326 10*3/uL (ref 150–400)
RBC: 4.6 MIL/uL (ref 3.87–5.11)
RDW: 12.8 % (ref 11.5–15.5)
WBC Count: 9.8 10*3/uL (ref 4.0–10.5)
nRBC: 0 % (ref 0.0–0.2)

## 2022-03-30 LAB — FERRITIN: Ferritin: 49 ng/mL (ref 11–307)

## 2022-03-30 LAB — VITAMIN B12: Vitamin B-12: 2523 pg/mL — ABNORMAL HIGH (ref 180–914)

## 2022-03-30 LAB — IRON AND TIBC
Iron: 119 ug/dL (ref 28–170)
Saturation Ratios: 31 % (ref 10.4–31.8)
TIBC: 383 ug/dL (ref 250–450)
UIBC: 264 ug/dL

## 2022-03-31 ENCOUNTER — Encounter: Payer: Self-pay | Admitting: Hematology and Oncology

## 2022-03-31 ENCOUNTER — Telehealth: Payer: Self-pay

## 2022-03-31 NOTE — Telephone Encounter (Signed)
-----   Message from Marvia Pickles, PA-C sent at 03/31/2022  8:56 AM EST ----- Please let her know her hemoglobin and iron levels are normal. Her B12 is elevated, so she should stop B12 supplement.  Keep appointment in 3 months.  Thank you

## 2022-03-31 NOTE — Telephone Encounter (Signed)
Patient notified and voiced understanding.

## 2022-04-11 DIAGNOSIS — K22719 Barrett's esophagus with dysplasia, unspecified: Secondary | ICD-10-CM | POA: Diagnosis not present

## 2022-04-11 DIAGNOSIS — K22710 Barrett's esophagus with low grade dysplasia: Secondary | ICD-10-CM | POA: Diagnosis not present

## 2022-04-11 DIAGNOSIS — K219 Gastro-esophageal reflux disease without esophagitis: Secondary | ICD-10-CM | POA: Diagnosis not present

## 2022-04-11 DIAGNOSIS — K317 Polyp of stomach and duodenum: Secondary | ICD-10-CM | POA: Diagnosis not present

## 2022-04-11 DIAGNOSIS — K621 Rectal polyp: Secondary | ICD-10-CM | POA: Diagnosis not present

## 2022-04-11 DIAGNOSIS — K635 Polyp of colon: Secondary | ICD-10-CM | POA: Diagnosis not present

## 2022-04-12 ENCOUNTER — Ambulatory Visit
Admission: RE | Admit: 2022-04-12 | Discharge: 2022-04-12 | Disposition: A | Discharge status: Home / Self Care | Payer: Medicare Other | Source: Ambulatory Visit | Attending: Family Medicine | Admitting: Family Medicine

## 2022-04-12 DIAGNOSIS — Z1231 Encounter for screening mammogram for malignant neoplasm of breast: Secondary | ICD-10-CM | POA: Diagnosis not present

## 2022-04-12 DIAGNOSIS — Z780 Asymptomatic menopausal state: Secondary | ICD-10-CM | POA: Diagnosis not present

## 2022-04-12 DIAGNOSIS — M85851 Other specified disorders of bone density and structure, right thigh: Secondary | ICD-10-CM | POA: Diagnosis not present

## 2022-04-12 DIAGNOSIS — E2839 Other primary ovarian failure: Secondary | ICD-10-CM | POA: Diagnosis present

## 2022-04-14 DIAGNOSIS — K317 Polyp of stomach and duodenum: Secondary | ICD-10-CM | POA: Diagnosis not present

## 2022-04-14 DIAGNOSIS — K635 Polyp of colon: Secondary | ICD-10-CM | POA: Diagnosis not present

## 2022-04-14 DIAGNOSIS — K22710 Barrett's esophagus with low grade dysplasia: Secondary | ICD-10-CM | POA: Diagnosis not present

## 2022-04-14 DIAGNOSIS — K621 Rectal polyp: Secondary | ICD-10-CM | POA: Diagnosis not present

## 2022-05-22 DIAGNOSIS — Z7982 Long term (current) use of aspirin: Secondary | ICD-10-CM | POA: Diagnosis not present

## 2022-05-22 DIAGNOSIS — I10 Essential (primary) hypertension: Secondary | ICD-10-CM | POA: Diagnosis not present

## 2022-05-22 DIAGNOSIS — Z9049 Acquired absence of other specified parts of digestive tract: Secondary | ICD-10-CM | POA: Diagnosis not present

## 2022-05-22 DIAGNOSIS — E785 Hyperlipidemia, unspecified: Secondary | ICD-10-CM | POA: Diagnosis not present

## 2022-05-22 DIAGNOSIS — G2581 Restless legs syndrome: Secondary | ICD-10-CM | POA: Diagnosis not present

## 2022-05-22 DIAGNOSIS — Z87891 Personal history of nicotine dependence: Secondary | ICD-10-CM | POA: Diagnosis not present

## 2022-05-22 DIAGNOSIS — K22711 Barrett's esophagus with high grade dysplasia: Secondary | ICD-10-CM | POA: Diagnosis not present

## 2022-05-22 DIAGNOSIS — E119 Type 2 diabetes mellitus without complications: Secondary | ICD-10-CM | POA: Diagnosis not present

## 2022-05-22 DIAGNOSIS — K317 Polyp of stomach and duodenum: Secondary | ICD-10-CM | POA: Diagnosis not present

## 2022-05-22 DIAGNOSIS — Z7984 Long term (current) use of oral hypoglycemic drugs: Secondary | ICD-10-CM | POA: Diagnosis not present

## 2022-05-22 DIAGNOSIS — Z79899 Other long term (current) drug therapy: Secondary | ICD-10-CM | POA: Diagnosis not present

## 2022-05-22 DIAGNOSIS — K22710 Barrett's esophagus with low grade dysplasia: Secondary | ICD-10-CM | POA: Diagnosis not present

## 2022-05-31 ENCOUNTER — Encounter: Payer: Self-pay | Admitting: Hematology and Oncology

## 2022-06-02 DIAGNOSIS — E7800 Pure hypercholesterolemia, unspecified: Secondary | ICD-10-CM | POA: Diagnosis not present

## 2022-06-02 DIAGNOSIS — I10 Essential (primary) hypertension: Secondary | ICD-10-CM | POA: Diagnosis not present

## 2022-06-02 DIAGNOSIS — Z79899 Other long term (current) drug therapy: Secondary | ICD-10-CM | POA: Diagnosis not present

## 2022-06-02 DIAGNOSIS — E1169 Type 2 diabetes mellitus with other specified complication: Secondary | ICD-10-CM | POA: Diagnosis not present

## 2022-06-02 DIAGNOSIS — E785 Hyperlipidemia, unspecified: Secondary | ICD-10-CM | POA: Diagnosis not present

## 2022-06-05 DIAGNOSIS — K22710 Barrett's esophagus with low grade dysplasia: Secondary | ICD-10-CM | POA: Diagnosis not present

## 2022-06-13 DIAGNOSIS — I447 Left bundle-branch block, unspecified: Secondary | ICD-10-CM | POA: Diagnosis not present

## 2022-06-13 DIAGNOSIS — Z7982 Long term (current) use of aspirin: Secondary | ICD-10-CM | POA: Diagnosis not present

## 2022-06-13 DIAGNOSIS — C159 Malignant neoplasm of esophagus, unspecified: Secondary | ICD-10-CM | POA: Diagnosis not present

## 2022-06-13 DIAGNOSIS — Z9889 Other specified postprocedural states: Secondary | ICD-10-CM | POA: Diagnosis not present

## 2022-06-13 DIAGNOSIS — Z79899 Other long term (current) drug therapy: Secondary | ICD-10-CM | POA: Diagnosis not present

## 2022-06-13 DIAGNOSIS — Z538 Procedure and treatment not carried out for other reasons: Secondary | ICD-10-CM | POA: Diagnosis not present

## 2022-06-13 DIAGNOSIS — Z87891 Personal history of nicotine dependence: Secondary | ICD-10-CM | POA: Diagnosis not present

## 2022-06-13 DIAGNOSIS — R9431 Abnormal electrocardiogram [ECG] [EKG]: Secondary | ICD-10-CM | POA: Diagnosis not present

## 2022-06-13 DIAGNOSIS — Z7984 Long term (current) use of oral hypoglycemic drugs: Secondary | ICD-10-CM | POA: Diagnosis not present

## 2022-06-13 DIAGNOSIS — Z7951 Long term (current) use of inhaled steroids: Secondary | ICD-10-CM | POA: Diagnosis not present

## 2022-06-15 DIAGNOSIS — E119 Type 2 diabetes mellitus without complications: Secondary | ICD-10-CM | POA: Diagnosis not present

## 2022-06-15 DIAGNOSIS — K2100 Gastro-esophageal reflux disease with esophagitis, without bleeding: Secondary | ICD-10-CM | POA: Diagnosis not present

## 2022-06-15 DIAGNOSIS — K22710 Barrett's esophagus with low grade dysplasia: Secondary | ICD-10-CM | POA: Diagnosis not present

## 2022-06-15 DIAGNOSIS — Z01810 Encounter for preprocedural cardiovascular examination: Secondary | ICD-10-CM | POA: Diagnosis not present

## 2022-06-23 DIAGNOSIS — I119 Hypertensive heart disease without heart failure: Secondary | ICD-10-CM | POA: Diagnosis not present

## 2022-06-23 DIAGNOSIS — K2100 Gastro-esophageal reflux disease with esophagitis, without bleeding: Secondary | ICD-10-CM | POA: Diagnosis not present

## 2022-06-23 DIAGNOSIS — Z01810 Encounter for preprocedural cardiovascular examination: Secondary | ICD-10-CM | POA: Diagnosis not present

## 2022-06-23 DIAGNOSIS — E119 Type 2 diabetes mellitus without complications: Secondary | ICD-10-CM | POA: Diagnosis not present

## 2022-06-23 DIAGNOSIS — K22710 Barrett's esophagus with low grade dysplasia: Secondary | ICD-10-CM | POA: Diagnosis not present

## 2022-06-28 NOTE — Progress Notes (Signed)
[facility]  [address] [place],  [place]  [address] [phone]  Clinic Day:  06/30/2022  Referring physician: Leonides Sake, MD  ASSESSMENT & PLAN:   Assessment & Plan: Iron deficiency anemia Iron deficiency anemia diagnosed in May.  She did not tolerate oral iron supplementation.  She received IV iron in the form of Feraheme in June.  She underwent EGD and colonoscopy at the end of June and no source of bleeding was found.  She had removal of stomach polyps, which were benign.  She was found to have Barrett's esophagus.  She also had removal of colon polyps, which were hyperplastic polyps. She was seen at [place].  Repeat EGD.  This revealed more advanced changes of Barrett's, so endoscopic submucosal dissection was scheduled, but not done due to EKG abnormalities.  Hopefully, this will be resolved and she will be able to undergo the procedure in the near future.  Her hemoglobin and iron parameters remain normal.  Her B12 was elevated in November, so I had her stop her B12 supplement.  B12 is normal today.  I will plan to see her back in 3 months for repeat clinical assessment.  Family history of ovarian cancer Due to her family history of ovarian cancer, she was seen by the genetic counselor and underwent testing for hereditary breast and ovarian cancer.  The [facility] [employer] +RNAinsight Panel did not reveal any clinically significant mutations.  There were variants of uncertain significance of LZTR 1 and RECQL.   The patient understands the plans discussed today and is in agreement with them.  She knows to contact our office if she develops concerns prior to her next appointment.   I provided 15 minutes of face-to-face time during this encounter and > 50% was spent counseling as documented under my assessment and plan.    Jacqueline Pickles, PA-C  [facility] AT [facility] [place] [address] Dept: [phone] Dept Fax: [phone]   Orders Placed This Encounter  Procedures   CBC with Differential ([facility] Only)    Standing Status:   Future    Number of Occurrences:   1    Standing Expiration Date:   07/01/2023   Ferritin    Standing Status:   Future    Number of Occurrences:   1    Standing Expiration Date:   07/01/2023   Iron and TIBC    Standing Status:   Future    Number of Occurrences:   1    Standing Expiration Date:   07/01/2023   Vitamin B12    Standing Status:   Future    Number of Occurrences:   1    Standing Expiration Date:   07/01/2023      CHIEF COMPLAINT:  CC: Iron deficiency anemia  Current Treatment:  Surveillance  HISTORY OF PRESENT ILLNESS:  Jacqueline Alexander is a 72 y.o. female with iron deficiency anemia who I began seeing in June.  On May 31, her hemoglobin was 9.7 with an MCV of 72 and ferritin 5 ug/mL.  White blood count and platelets were normal.  The patient had evidence of iron deficiency dating back to June 2022, at which time her hemoglobin was 10.7 with an MCV of 74, and ferritin 5 ug/mL.  She does not tolerate oral iron due to constipation, so was referred to [place] for IV iron.  She received IV Feraheme in  June with normalization of her hemoglobin and iron stores.  She has not had recurrent anemia.  Her B12 level was elevated in November, so we had her discontinue B12 supplement.  She has a history of Barrett's esophagus and her last EGD was in March 2021 at [facility] in [place].  This revealed persistent Barrett's disease. She continues pantoprazole 20 mg daily.  Her last colonoscopy was in January 2018 at [facility].  This was normal, so follow-up in 10 years was recommended.  The patient gave a history of B12 deficiency and was not taking B12 supplement.  B12 level was well within normal limits at 749 in June.  The patient has never had gastric surgery.  She underwent total hysterectomy and bilateral  salpingo-oophorectomy with bladder tacking at age 24 for bladder and uterine prolapse and denied vaginal bleeding. She is on aspirin 81 mg daily.  She denied heavy alcohol use, but does drink socially.    She has been referred to [facility] for follow-up of her Barrett's.  INTERVAL HISTORY:  Jacqueline Alexander is here today for repeat clinical assessment.  She reports persistent fatigue, despite a normal hemoglobin.  She reports lack of motivation.  She states that her insurance plan has set her up for 8 weeks of virtual counseling for depression.  She denies shortness of breath, chest pain, palpitations or lightheadedness.  She denies fevers or chills. She reports hand and shoulder pain, which she attributes to arthritis. Her appetite is good. Her weight has increased 3 pounds over last 3 months .  She was seen at [facility] and had a repeat EGD.  This revealed more advanced changes of Barrett's so endoscopic submucosal dissection was scheduled.  This procedure was not done due to findings of a left bundle branch block preop EKG. She has seen her cardiologist and had an echocardiogram.  She is awaiting recommendations for further evaluation with a stress test or cardiac catheterization.   REVIEW OF SYSTEMS:  Review of Systems  Constitutional:  Positive for fatigue. Negative for appetite change, chills, fever and unexpected weight change.  HENT:   Negative for lump/mass, mouth sores and sore throat.   Respiratory:  Negative for cough and shortness of breath.   Cardiovascular:  Negative for chest pain and leg swelling.  Gastrointestinal:  Negative for abdominal pain, constipation, diarrhea, nausea and vomiting.  Genitourinary:  Negative for difficulty urinating, dysuria, frequency and hematuria.   Musculoskeletal:  Negative for arthralgias, back pain and myalgias.  Skin:  Negative for rash.  Neurological:  Negative for dizziness and headaches.  Hematological:  Negative for adenopathy. Does not bruise/bleed easily.   Psychiatric/Behavioral:  Positive for depression. Negative for sleep disturbance. The patient is not nervous/anxious.      VITALS:  Blood pressure 120/64, pulse 81, temperature 97.9 F (36.6 C), temperature source Oral, resp. rate 18, height 5' 4.5" (1.638 m), weight 160 lb (72.6 kg), SpO2 96 %.  Wt Readings from Last 3 Encounters:  06/30/22 160 lb (72.6 kg)  03/30/22 157 lb 14.4 oz (71.6 kg)  12/28/21 155 lb 11.2 oz (70.6 kg)    Body mass index is 27.04 kg/m.  Performance status (ECOG): 1 - Symptomatic but completely ambulatory  PHYSICAL EXAM:  Physical Exam Vitals and nursing note reviewed.  Constitutional:      General: She is not in acute distress.    Appearance: Normal appearance.  HENT:     Head: Normocephalic and atraumatic.     Mouth/Throat:  Mouth: Mucous membranes are moist.     Pharynx: Oropharynx is clear. No oropharyngeal exudate or posterior oropharyngeal erythema.  Eyes:     General: No scleral icterus.    Extraocular Movements: Extraocular movements intact.     Conjunctiva/sclera: Conjunctivae normal.     Pupils: Pupils are equal, round, and reactive to light.  Cardiovascular:     Rate and Rhythm: Normal rate and regular rhythm.     Heart sounds: Normal heart sounds. No murmur heard.    No friction rub. No gallop.  Pulmonary:     Effort: Pulmonary effort is normal.     Breath sounds: Normal breath sounds. No wheezing, rhonchi or rales.  Abdominal:     General: There is no distension.     Palpations: Abdomen is soft. There is no hepatomegaly, splenomegaly or mass.     Tenderness: There is no abdominal tenderness.  Musculoskeletal:        General: Normal range of motion.     Cervical back: Normal range of motion and neck supple. No tenderness.     Right lower leg: No edema.     Left lower leg: No edema.  Lymphadenopathy:     Cervical: No cervical adenopathy.     Upper Body:     Right upper body: No supraclavicular or axillary adenopathy.     Left  upper body: No supraclavicular or axillary adenopathy.     Lower Body: No right inguinal adenopathy. No left inguinal adenopathy.  Skin:    General: Skin is warm and dry.     Coloration: Skin is not jaundiced.     Findings: No rash.  Neurological:     Mental Status: She is alert and oriented to person, place, and time.     Cranial Nerves: No cranial nerve deficit.  Psychiatric:        Mood and Affect: Mood normal.        Behavior: Behavior normal.        Thought Content: Thought content normal.     LABS:      Latest Ref Rng & Units 06/30/2022   10:02 AM 03/30/2022   11:04 AM 12/28/2021   12:00 AM  CBC  WBC 4.0 - 10.5 K/uL 9.4  9.8  9.9      Hemoglobin 12.0 - 15.0 g/dL 13.5  13.4  13.1      Hematocrit 36.0 - 46.0 % 42.6  42.1  40      Platelets 150 - 400 K/uL 336  326  295         This result is from an external source.      Latest Ref Rng & Units 10/28/2021   12:00 AM  CMP  BUN 4 - 21 16      Creatinine 0.5 - 1.1 0.6      Sodium 137 - 147 140      Potassium 3.5 - 5.1 mEq/L 4.1      Chloride 99 - 108 103      CO2 13 - 22 27      Calcium 8.7 - 10.7 9.3      Alkaline Phos 25 - 125 64      AST 13 - 35 27      ALT 7 - 35 U/L 14         This result is from an external source.     No results found for: "CEA1", "CEA" / No results found for: "CEA1", "CEA" No results found  for: "PSA1" No results found for: "[id number]" No results found for: "CAN125"  No results found for: "TOTALPROTELP", "ALBUMINELP", "A1GS", "A2GS", "BETS", "BETA2SER", "GAMS", "MSPIKE", "SPEI" Lab Results  Component Value Date   TIBC 367 06/30/2022   TIBC 383 03/30/2022   TIBC 325 12/28/2021   FERRITIN 21 06/30/2022   FERRITIN 49 03/30/2022   FERRITIN 54 12/28/2021   IRONPCTSAT 23 06/30/2022   IRONPCTSAT 31 03/30/2022   IRONPCTSAT 21 12/28/2021   No results found for: "LDH"  STUDIES:  No results found.    HISTORY:   Past Medical History:  Diagnosis Date   Anxiety    Arthritis    Barrett  esophagus    Chronic diarrhea    Colon polyps    Depression    Diabetes mellitus without complication ([place])    Family history of ovarian cancer 12/11/2021   Family history of stomach cancer 12/11/2021   GERD (gastroesophageal reflux disease)    Hyperlipemia    Hypertension    Insomnia    Lumbar radiculitis    Mitral insufficiency    Restless leg    Restless leg syndrome    Sensory urge incontinence     Past Surgical History:  Procedure Laterality Date   ABDOMINAL HYSTERECTOMY     ABDOMINAL HYSTERECTOMY     CARPAL TUNNEL RELEASE Left    CHOLECYSTECTOMY     COLONOSCOPY N/A 11/10/2021   Procedure: COLONOSCOPY;  Surgeon: Annamaria Helling, DO;  Location[facility] ENDOSCOPY;  Service: Gastroenterology;  Laterality: N/A;   ESOPHAGOGASTRODUODENOSCOPY N/A 11/10/2021   Procedure: ESOPHAGOGASTRODUODENOSCOPY (EGD);  Surgeon: Annamaria Helling, DO;  Location: [facility] ENDOSCOPY;  Service: Gastroenterology;  Laterality: N/A;  DM   ESOPHAGOGASTRODUODENOSCOPY (EGD) WITH PROPOFOL N/A 08/06/2019   Procedure: ESOPHAGOGASTRODUODENOSCOPY (EGD) WITH PROPOFOL;  Surgeon: Toledo, Benay Pike, MD;  Location: [facility] ENDOSCOPY;  Service: Gastroenterology;  Laterality: N/A;   HERNIA REPAIR     INCONTINENCE SURGERY     OOPHORECTOMY     TONSILLECTOMY      Family History  Problem Relation Age of Onset   Hypertension Mother    Arthritis Mother    Heart disease Mother    Skin cancer Mother        dx after 60   Hypertension Father    Tuberculosis Father    Emphysema Father    Heart attack Father    Hyperlipidemia Father    Hypertension Sister    Skin cancer Sister        dx after 65   Stomach cancer Maternal Uncle        dx late 51s   Lung cancer Paternal Aunt    Ovarian cancer Paternal Aunt        dx after 54   Ovarian cancer Paternal Grandmother        d. late 35s   Pancreatitis Niece        d. 61   Breast cancer Neg Hx     Social History:  reports that she quit smoking about 12 years ago. Her  smoking use included cigarettes. She has never used smokeless tobacco. She reports that she does not currently use alcohol. She reports that she does not currently use drugs.The patient is alone today.  Allergies:  Allergies  Allergen Reactions   Morphine Nausea And Vomiting and Nausea Only   Ace Inhibitors    Ezetimibe     Current Medications: Current Outpatient Medications  Medication Sig Dispense Refill   loratadine (CLARITIN) 10 MG tablet Take 10 mg  by mouth daily.     mometasone (ELOCON) 0.1 % cream Apply 1 Application topically daily.     Multiple Vitamins-Minerals (MULTIPLE VITAMINS/WOMENS PO) Take 1 tablet by mouth daily.     acidophilus (RISAQUAD) CAPS capsule Take by mouth daily.     albuterol (VENTOLIN HFA) 108 (90 Base) MCG/ACT inhaler SMARTSIG:2 Puff(s) By Mouth Every 4-6 Hours PRN     amLODipine (NORVASC) 5 MG tablet Take 5 mg by mouth daily.     aspirin EC 81 MG tablet Take 81 mg by mouth daily. Swallow whole.     BIOTIN PO Take 1 tablet by mouth daily.     carvedilol (COREG) 25 MG tablet Take by mouth.     cholecalciferol (VITAMIN D3) 10 MCG (400 UNIT) TABS tablet Take 1,000 Units by mouth.     clobetasol ointment (TEMOVATE) 0.05 % APPLY DAILY TO THE SKIN AROUND THE VAGINA AND RECTUM FOR 12 WEEKS     Cysteamine Bitartrate (PROCYSBI) 300 MG PACK lancets 30 gauge     estradiol (ESTRACE) 0.1 MG/GM vaginal cream Place 1 Applicatorful vaginally at bedtime.     FLUoxetine (PROZAC) 20 MG capsule      LORazepam (ATIVAN) 0.5 MG tablet TK 1 T PO ONCE DAILY PRN  0   metFORMIN (GLUCOPHAGE-XR) 750 MG 24 hr tablet Take 750 mg by mouth 2 (two) times daily.     ONETOUCH VERIO test strip daily.     pantoprazole (PROTONIX) 40 MG tablet 40 mg 2 (two) times daily.     polyethylene glycol-electrolytes (NULYTELY) 420 g solution Take by mouth as directed. (Patient not taking: Reported on 03/30/2022)     Probiotic Product (PROBIOTIC BLEND PO) Take 1 tablet by mouth daily.     rosuvastatin  (CRESTOR) 5 MG tablet Take 5 mg by mouth daily.     vitamin B-12 (CYANOCOBALAMIN) 250 MCG tablet Take 2,500 mcg by mouth daily. (Patient not taking: Reported on 06/30/2022)     No current facility-administered medications for this visit.

## 2022-06-28 NOTE — Assessment & Plan Note (Addendum)
Iron deficiency anemia diagnosed in May.  She did not tolerate oral iron supplementation.  She received IV iron in the form of Feraheme in June.  She underwent EGD and colonoscopy at the end of June and no source of bleeding was found.  She had removal of stomach polyps, which were benign.  She was found to have Barrett's esophagus.  She also had removal of colon polyps, which were hyperplastic polyps. She was seen at [place].  Repeat EGD.  This revealed more advanced changes of Barrett's, so endoscopic submucosal dissection was scheduled, but not done due to EKG abnormalities.  Hopefully, this will be resolved and she will be able to undergo the procedure in the near future.  Her hemoglobin and iron parameters remain normal.  Her B12 was elevated in November, so I had her stop her B12 supplement.  B12 is normal today.  I will plan to see her back in 3 months for repeat clinical assessment.

## 2022-06-30 ENCOUNTER — Encounter: Payer: Self-pay | Admitting: Hematology and Oncology

## 2022-06-30 ENCOUNTER — Telehealth: Payer: Self-pay

## 2022-06-30 ENCOUNTER — Inpatient Hospital Stay: Payer: 59

## 2022-06-30 ENCOUNTER — Inpatient Hospital Stay: Payer: 59 | Attending: Hematology and Oncology | Admitting: Hematology and Oncology

## 2022-06-30 VITALS — BP 120/64 | HR 81 | Temp 97.9°F | Resp 18 | Ht 64.5 in | Wt 160.0 lb

## 2022-06-30 DIAGNOSIS — Z7982 Long term (current) use of aspirin: Secondary | ICD-10-CM | POA: Diagnosis not present

## 2022-06-30 DIAGNOSIS — D509 Iron deficiency anemia, unspecified: Secondary | ICD-10-CM

## 2022-06-30 DIAGNOSIS — Z8041 Family history of malignant neoplasm of ovary: Secondary | ICD-10-CM | POA: Diagnosis not present

## 2022-06-30 LAB — CBC WITH DIFFERENTIAL (CANCER CENTER ONLY)
Abs Immature Granulocytes: 0.02 10*3/uL (ref 0.00–0.07)
Basophils Absolute: 0.1 10*3/uL (ref 0.0–0.1)
Basophils Relative: 1 %
Eosinophils Absolute: 0.3 10*3/uL (ref 0.0–0.5)
Eosinophils Relative: 3 %
HCT: 42.6 % (ref 36.0–46.0)
Hemoglobin: 13.5 g/dL (ref 12.0–15.0)
Immature Granulocytes: 0 %
Lymphocytes Relative: 29 %
Lymphs Abs: 2.7 10*3/uL (ref 0.7–4.0)
MCH: 28.5 pg (ref 26.0–34.0)
MCHC: 31.7 g/dL (ref 30.0–36.0)
MCV: 90.1 fL (ref 80.0–100.0)
Monocytes Absolute: 0.7 10*3/uL (ref 0.1–1.0)
Monocytes Relative: 7 %
Neutro Abs: 5.6 10*3/uL (ref 1.7–7.7)
Neutrophils Relative %: 60 %
Platelet Count: 336 10*3/uL (ref 150–400)
RBC: 4.73 MIL/uL (ref 3.87–5.11)
RDW: 12.7 % (ref 11.5–15.5)
WBC Count: 9.4 10*3/uL (ref 4.0–10.5)
nRBC: 0 % (ref 0.0–0.2)

## 2022-06-30 LAB — IRON AND TIBC
Iron: 84 ug/dL (ref 28–170)
Saturation Ratios: 23 % (ref 10.4–31.8)
TIBC: 367 ug/dL (ref 250–450)
UIBC: 283 ug/dL

## 2022-06-30 LAB — FERRITIN: Ferritin: 21 ng/mL (ref 11–307)

## 2022-06-30 LAB — VITAMIN B12: Vitamin B-12: 427 pg/mL (ref 180–914)

## 2022-06-30 NOTE — Telephone Encounter (Signed)
Patient notified and voiced understanding.

## 2022-06-30 NOTE — Telephone Encounter (Signed)
-----   Message from Marvia Pickles, PA-C sent at 06/30/2022  2:42 PM EST ----- Please let her know her hemoglobin is still normal, B12 normal and iron studies adequate. Thanks

## 2022-06-30 NOTE — Telephone Encounter (Signed)
Patient notified and voiced understanding of recent lab results.

## 2022-06-30 NOTE — Assessment & Plan Note (Signed)
Due to her family history of ovarian cancer, she was seen by the genetic counselor and underwent testing for hereditary breast and ovarian cancer.  The [facility] [employer]Can[employer] +RNAinsight Panel did not reveal any clinically significant mutations.  There were variants of uncertain significance of LZTR 1 and RECQL.

## 2022-07-12 DIAGNOSIS — Z1152 Encounter for screening for COVID-19: Secondary | ICD-10-CM | POA: Diagnosis not present

## 2022-07-12 DIAGNOSIS — I10 Essential (primary) hypertension: Secondary | ICD-10-CM | POA: Diagnosis not present

## 2022-07-12 DIAGNOSIS — E119 Type 2 diabetes mellitus without complications: Secondary | ICD-10-CM | POA: Diagnosis not present

## 2022-07-12 DIAGNOSIS — Z20822 Contact with and (suspected) exposure to covid-19: Secondary | ICD-10-CM | POA: Diagnosis not present

## 2022-07-17 DIAGNOSIS — K2270 Barrett's esophagus without dysplasia: Secondary | ICD-10-CM | POA: Diagnosis not present

## 2022-07-17 DIAGNOSIS — I447 Left bundle-branch block, unspecified: Secondary | ICD-10-CM | POA: Diagnosis not present

## 2022-07-18 ENCOUNTER — Ambulatory Visit: Payer: 59 | Admitting: Obstetrics and Gynecology

## 2022-07-18 ENCOUNTER — Encounter: Payer: Self-pay | Admitting: Obstetrics and Gynecology

## 2022-07-18 VITALS — BP 135/80 | HR 82 | Ht 63.7 in | Wt 155.8 lb

## 2022-07-18 DIAGNOSIS — R350 Frequency of micturition: Secondary | ICD-10-CM

## 2022-07-18 DIAGNOSIS — N3281 Overactive bladder: Secondary | ICD-10-CM

## 2022-07-18 DIAGNOSIS — L900 Lichen sclerosus et atrophicus: Secondary | ICD-10-CM | POA: Diagnosis not present

## 2022-07-18 DIAGNOSIS — N952 Postmenopausal atrophic vaginitis: Secondary | ICD-10-CM | POA: Diagnosis not present

## 2022-07-18 DIAGNOSIS — R159 Full incontinence of feces: Secondary | ICD-10-CM

## 2022-07-18 LAB — POCT URINALYSIS DIPSTICK
Bilirubin, UA: NEGATIVE
Blood, UA: NEGATIVE
Glucose, UA: NEGATIVE
Ketones, UA: NEGATIVE
Leukocytes, UA: NEGATIVE
Nitrite, UA: NEGATIVE
Protein, UA: NEGATIVE
Spec Grav, UA: >=1.03 — AB (ref 1.010–1.025)
Urobilinogen, UA: 0.2 E.U./dL
pH, UA: 6 (ref 5.0–8.0)

## 2022-07-18 MED ORDER — ESTRADIOL 0.1 MG/GM VA CREA: TOPICAL_CREAM | VAGINAL | 11 refills | Status: DC

## 2022-07-18 MED ORDER — CLOBETASOL PROP EMOLLIENT BASE 0.05 % EX CREA: 0.5000 g | TOPICAL_CREAM | Freq: Every evening | CUTANEOUS | 11 refills | Status: DC

## 2022-07-18 NOTE — Patient Instructions (Addendum)
Accidental Bowel Leakage: Our goal is to achieve formed bowel movements daily or every-other-day without leakage.  You may need to try different combinations of the following options to find what works best for you.  Some management options include: Dietary changes (more leafy greens, vegetables and fruits; less processed foods) Fiber supplementation (Metamucil or something with psyllium as active ingredient)- take daily.  Over-the-counter imodium (tablets or liquid) to help solidify the stool and prevent leakage of stool. If you get constipated you can use Miralax as needed to achieve bowel movements.   Start cloetasol cream on the labial tissues every night for a month then reduce to 3 times per week.   Use vaginal estrogen cream in the vagina twice a week.   Vulvovaginal moisturizer Options: Vitamin E oil (pump or capsule) or cream (Gene's Vit E Cream) Coconut oil Silicone-based lubricant for use during intercourse ("wet platinum" is a brand available at most drugstores) Crisco Consider the ingredients of the product - the fewer the ingredients the better!

## 2022-07-18 NOTE — Progress Notes (Signed)
Urogynecology New Patient Evaluation and Consultation  Referring Provider: Leonides Sake, MD PCP: Leonides Sake, MD Date of Service: 07/18/2022  SUBJECTIVE Chief Complaint: Vaginal Itching TED ALEJANDRO is a 72 y.o. female is here for vaginal atrophy. Patient also states she has nocturia and bowel leakage.)  History of Present Illness: SUSSIE DIOGO is a 72 y.o. White or Caucasian female presenting for evaluation of vaginal dryness.    Review of records significant for: Seen by [facility] U[facility] in 2022. She was using vaginal estrogen for atrophy and clobetasol for lichen sclerosus.  Urinary Symptoms: Leaks urine with cough/ sneeze, with movement to the bathroom, and with urgency Does not usually leak during the day, but she does have leakage at night.  Pad use: 1 liners/ mini-pads per day.   She is not bothered by her UI symptoms. Skin feels dry and itchy because of wetness. She tried vagisil and estradiol once a week. She is not using clobetasol cream anymore.  Has never been on a medication for her bladder. She has a history of mesh sling.  Has done pelvic PT in [facility] a few years ago but not sure it was helpful.   Day time voids 8.  Nocturia: 4 times per night to void. Voiding dysfunction: she does not empty her bladder well.  does not use a catheter to empty bladder.  When urinating, she feels a weak stream, difficulty starting urine stream, and to push on her belly or vagina to empty bladder Drinks: 1 cup coffee in AM, 3-16 oz bottles water, bottle of dr pepper She does snore but has never had a sleep study.  She does drink water before bed to take pills. But overall does not drink a lot after dinner at 6pm.   UTIs:  0  UTI's in the last year.   Denies history of blood in urine and kidney or bladder stones  Pelvic Organ Prolapse Symptoms:                  She Denies a feeling of a bulge the vaginal area.  Has a history of "bladder tack" in 2006 at the time of  vaginal hysterectomy  Bowel Symptom: Bowel movements: 2-4 time(s) per day. Stool consistency: soft . Occasionally has some small balls of stool.  Straining: no.  Splinting: no.  Incomplete evacuation: yes.  Admits to accidental bowel leakage / fecal incontinence  Occurs: a few times per day  Consistency with leakage: soft  Bowel regimen: diet Last colonoscopy: Date 10/2021, Results- polyps removed Previously had SIBO and was treated for this which did help her loose stools.   Sexual Function Sexually active: no.   Pelvic Pain Denies pelvic pain    Past Medical History:  Past Medical History:  Diagnosis Date   Anxiety    Arthritis    Barrett esophagus    Chronic diarrhea    Colon polyps    Depression    Diabetes mellitus without complication ([place])    Family history of ovarian cancer 12/11/2021   Family history of stomach cancer 12/11/2021   GERD (gastroesophageal reflux disease)    Hyperlipemia    Hypertension    Insomnia    Lumbar radiculitis    Mitral insufficiency    Restless leg    Restless leg syndrome    Sensory urge incontinence      Past Surgical History:   Past Surgical History:  Procedure Laterality Date   CARPAL TUNNEL RELEASE Left  CHOLECYSTECTOMY     COLONOSCOPY N/A 11/10/2021   Procedure: COLONOSCOPY;  Surgeon: Annamaria Helling, DO;  Location: [facility] ENDOSCOPY;  Service: Gastroenterology;  Laterality: N/A;   ESOPHAGOGASTRODUODENOSCOPY N/A 11/10/2021   Procedure: ESOPHAGOGASTRODUODENOSCOPY (EGD);  Surgeon: Annamaria Helling, DO;  Location: [facility] ENDOSCOPY;  Service: Gastroenterology;  Laterality: N/A;  DM   ESOPHAGOGASTRODUODENOSCOPY (EGD) WITH PROPOFOL N/A 08/06/2019   Procedure: ESOPHAGOGASTRODUODENOSCOPY (EGD) WITH PROPOFOL;  Surgeon: Toledo, Benay Pike, MD;  Location: [facility] ENDOSCOPY;  Service: Gastroenterology;  Laterality: N/A;   HERNIA REPAIR     INCONTINENCE SURGERY     sling   OOPHORECTOMY     TONSILLECTOMY     VAGINAL  HYSTERECTOMY     VAGINAL PROLAPSE REPAIR       Past OB/GYN History: OB History  Gravida Para Term Preterm AB Living  '3 3 3     2  '$ SAB IAB Ectopic Multiple Live Births               # Outcome Date GA Lbr Len/2nd Weight Sex Delivery Anes PTL Lv  3 Term           2 Term           1 Term             Obstetric Comments  Middle son passed away.    Vaginal deliveries: 3,  Forceps/ Vacuum deliveries: 0, Cesarean section: 0 S/p hysterectomy   Medications: She has a current medication list which includes the following prescription(s): albuterol, amlodipine, aspirin ec, biotin, carvedilol, cholecalciferol, clobetasol ointment, clobetasol prop emollient base, procysbi, estradiol, [START ON 07/20/2022] estradiol, fluoxetine, loratadine, lorazepam, metformin, mometasone, multiple vitamins-minerals, onetouch verio, pantoprazole, probiotic product, rosuvastatin, vitamin b-12, and polyethylene glycol-electrolytes.   Allergies: Patient is allergic to morphine, ace inhibitors, and ezetimibe.   Social History:  Social History   Tobacco Use   Smoking status: Former    Packs/day: 1.00    Years: 40.00    Total pack years: 40.00    Types: Cigarettes    Quit date: 2012    Years since quitting: 12.1   Smokeless tobacco: Never  Vaping Use   Vaping Use: Never used  Substance Use Topics   Alcohol use: Yes    Comment: Ocassional   Drug use: Not Currently    Relationship status: divorced She is not employed. Regular exercise: No History of abuse: No  Family History:   Family History  Problem Relation Age of Onset   Hypertension Mother    Arthritis Mother    Heart disease Mother    Skin cancer Mother        dx after 58   Hypertension Father    Tuberculosis Father    Emphysema Father    Heart attack Father    Hyperlipidemia Father    Hypertension Sister    Skin cancer Sister        dx after 64   Stomach cancer Maternal Uncle        dx late 61s   Lung cancer Paternal Aunt     Ovarian cancer Paternal Aunt        dx after 32   Ovarian cancer Paternal Grandmother        d. late 66s   Pancreatitis Niece        d. 23   Breast cancer Neg Hx      Review of Systems: Review of Systems  Constitutional:  Negative for fever, malaise/fatigue and weight loss.  Respiratory:  Positive for cough and wheezing. Negative for shortness of breath.   Cardiovascular:  Negative for chest pain, palpitations and leg swelling.  Gastrointestinal:  Negative for abdominal pain and blood in stool.  Genitourinary:  Negative for dysuria.  Musculoskeletal:  Negative for myalgias.  Skin:  Negative for rash.  Neurological:  Negative for dizziness and headaches.  Endo/Heme/Allergies:  Does not bruise/bleed easily.  Psychiatric/Behavioral:  Positive for depression. The patient is not nervous/anxious.      OBJECTIVE Physical Exam: Vitals:   07/18/22 0857  BP: 135/80  Pulse: 82  Weight: 155 lb 12.8 oz (70.7 kg)  Height: 5' 3.7" (1.618 m)    Physical Exam Constitutional:      General: She is not in acute distress. Pulmonary:     Effort: Pulmonary effort is normal.  Abdominal:     General: There is no distension.     Palpations: Abdomen is soft.     Tenderness: There is no abdominal tenderness. There is no rebound.  Musculoskeletal:        General: No swelling. Normal range of motion.  Skin:    General: Skin is warm and dry.     Findings: No rash.  Neurological:     Mental Status: She is alert and oriented to person, place, and time.  Psychiatric:        Mood and Affect: Mood normal.        Behavior: Behavior normal.      GU / Detailed Urogynecologic Evaluation:  Pelvic Exam: Thin white tissue in the labia minora with fusion superiorly and at perineum/ introitus. Bartholin's and Skene's glands normal in appearance; urethral meatus normal in appearance, no urethral masses or discharge.   CST: negative  Speculum exam reveals normal vaginal mucosa with atrophy. Cervix  absent. Adnexa no mass, fullness, tenderness.    Pelvic floor strength I/V, puborectalis I/V external anal sphincter 0/V  Pelvic floor musculature: Right levator non-tender, Right obturator non-tender, Left levator non-tender, Left obturator non-tender  POP-Q:   POP-Q  -2.5                                            Aa   -2.5                                           Ba  -6                                              C   1.5                                            Gh  3.5                                            Pb  7  tvl   -3                                            Ap  -3                                            Bp                                                 D      Rectal Exam:  Normal sphincter tone, no distal rectocele, enterocoele not present, no rectal masses, unable to produce squeeze, dyssynergic  Post-Void Residual (PVR) by Bladder Scan: In order to evaluate bladder emptying, we discussed obtaining a postvoid residual and she agreed to this procedure.  Procedure: The ultrasound unit was placed on the patient's abdomen in the suprapubic region after the patient had voided. A PVR of 8 ml was obtained by bladder scan.  Laboratory Results: POC urine: negative   ASSESSMENT AND PLAN Ms. Rhyne is a 72 y.o. with:  1. Lichen sclerosus   2. Vaginal atrophy   3. Overactive bladder   4. Urinary frequency   5. Incontinence of feces, unspecified fecal incontinence type    Lichen sclerosus - restart clobetasol 0.5g nightly over vulva for a month then reduce to 3 times per week.   2. Vaginal atrophy - restart estrogen cream 0.5g twice weekly- place in vagina.  - Also discussed other vaginal moisturizers to use on a daily basis- coconut oil, vitamin E or lubricant with hyaluronic acid.   3. OAB - We discussed the symptoms of overactive bladder (OAB), which include urinary urgency, urinary frequency,  nocturia, with or without urge incontinence.  While we do not know the exact etiology of OAB, several treatment options exist. We discussed management including behavioral therapy (decreasing bladder irritants, urge suppression strategies, timed voids, bladder retraining), physical therapy, medication.  - She is not interested in a medication at this time but wants to try pelvic PT, referral placed.   4. Accidental Bowel Leakage:  - Treatment options include anti-diarrhea medication (loperamide/ Imodium OTC or prescription lomotil), fiber supplements, physical therapy, and possible sacral neuromodulation or surgery.   - Recommended daily metamucil for stool bulking and pelvic PT  Return 3 months for follow up  Jaquita Folds, MD

## 2022-08-08 DIAGNOSIS — I447 Left bundle-branch block, unspecified: Secondary | ICD-10-CM | POA: Diagnosis not present

## 2022-08-08 DIAGNOSIS — I5020 Unspecified systolic (congestive) heart failure: Secondary | ICD-10-CM | POA: Diagnosis not present

## 2022-08-08 DIAGNOSIS — Z01818 Encounter for other preprocedural examination: Secondary | ICD-10-CM | POA: Diagnosis not present

## 2022-08-08 DIAGNOSIS — Z87891 Personal history of nicotine dependence: Secondary | ICD-10-CM | POA: Diagnosis not present

## 2022-08-08 DIAGNOSIS — K219 Gastro-esophageal reflux disease without esophagitis: Secondary | ICD-10-CM | POA: Diagnosis not present

## 2022-08-08 DIAGNOSIS — I10 Essential (primary) hypertension: Secondary | ICD-10-CM | POA: Diagnosis not present

## 2022-08-08 DIAGNOSIS — E119 Type 2 diabetes mellitus without complications: Secondary | ICD-10-CM | POA: Diagnosis not present

## 2022-08-08 DIAGNOSIS — I5021 Acute systolic (congestive) heart failure: Secondary | ICD-10-CM | POA: Diagnosis not present

## 2022-08-08 DIAGNOSIS — E785 Hyperlipidemia, unspecified: Secondary | ICD-10-CM | POA: Diagnosis not present

## 2022-08-08 DIAGNOSIS — R9431 Abnormal electrocardiogram [ECG] [EKG]: Secondary | ICD-10-CM | POA: Diagnosis not present

## 2022-08-08 DIAGNOSIS — R931 Abnormal findings on diagnostic imaging of heart and coronary circulation: Secondary | ICD-10-CM | POA: Diagnosis not present

## 2022-08-08 DIAGNOSIS — Z01810 Encounter for preprocedural cardiovascular examination: Secondary | ICD-10-CM | POA: Diagnosis not present

## 2022-08-08 DIAGNOSIS — I2510 Atherosclerotic heart disease of native coronary artery without angina pectoris: Secondary | ICD-10-CM | POA: Diagnosis not present

## 2022-08-16 DIAGNOSIS — R799 Abnormal finding of blood chemistry, unspecified: Secondary | ICD-10-CM | POA: Diagnosis not present

## 2022-08-16 DIAGNOSIS — R001 Bradycardia, unspecified: Secondary | ICD-10-CM | POA: Diagnosis not present

## 2022-08-16 DIAGNOSIS — E876 Hypokalemia: Secondary | ICD-10-CM | POA: Diagnosis not present

## 2022-08-16 DIAGNOSIS — R55 Syncope and collapse: Secondary | ICD-10-CM | POA: Diagnosis not present

## 2022-08-16 DIAGNOSIS — S0031XA Abrasion of nose, initial encounter: Secondary | ICD-10-CM | POA: Diagnosis not present

## 2022-08-16 DIAGNOSIS — Z79899 Other long term (current) drug therapy: Secondary | ICD-10-CM | POA: Diagnosis not present

## 2022-08-16 DIAGNOSIS — E039 Hypothyroidism, unspecified: Secondary | ICD-10-CM | POA: Diagnosis not present

## 2022-08-16 DIAGNOSIS — R7401 Elevation of levels of liver transaminase levels: Secondary | ICD-10-CM | POA: Diagnosis not present

## 2022-08-16 DIAGNOSIS — E871 Hypo-osmolality and hyponatremia: Secondary | ICD-10-CM | POA: Diagnosis not present

## 2022-08-16 DIAGNOSIS — E785 Hyperlipidemia, unspecified: Secondary | ICD-10-CM | POA: Diagnosis not present

## 2022-08-16 DIAGNOSIS — Z791 Long term (current) use of non-steroidal anti-inflammatories (NSAID): Secondary | ICD-10-CM | POA: Diagnosis not present

## 2022-08-16 DIAGNOSIS — K2970 Gastritis, unspecified, without bleeding: Secondary | ICD-10-CM | POA: Diagnosis not present

## 2022-08-16 DIAGNOSIS — Z9181 History of falling: Secondary | ICD-10-CM | POA: Diagnosis not present

## 2022-08-16 DIAGNOSIS — K529 Noninfective gastroenteritis and colitis, unspecified: Secondary | ICD-10-CM | POA: Diagnosis not present

## 2022-08-16 DIAGNOSIS — R569 Unspecified convulsions: Secondary | ICD-10-CM | POA: Diagnosis not present

## 2022-08-16 DIAGNOSIS — Z7722 Contact with and (suspected) exposure to environmental tobacco smoke (acute) (chronic): Secondary | ICD-10-CM | POA: Diagnosis not present

## 2022-08-16 DIAGNOSIS — K209 Esophagitis, unspecified without bleeding: Secondary | ICD-10-CM | POA: Diagnosis not present

## 2022-08-16 DIAGNOSIS — K760 Fatty (change of) liver, not elsewhere classified: Secondary | ICD-10-CM | POA: Diagnosis not present

## 2022-08-16 DIAGNOSIS — I10 Essential (primary) hypertension: Secondary | ICD-10-CM | POA: Diagnosis not present

## 2022-08-16 DIAGNOSIS — I5020 Unspecified systolic (congestive) heart failure: Secondary | ICD-10-CM | POA: Diagnosis not present

## 2022-08-22 DIAGNOSIS — Z01818 Encounter for other preprocedural examination: Secondary | ICD-10-CM | POA: Diagnosis not present

## 2022-08-22 DIAGNOSIS — Z23 Encounter for immunization: Secondary | ICD-10-CM | POA: Diagnosis not present

## 2022-08-22 DIAGNOSIS — E119 Type 2 diabetes mellitus without complications: Secondary | ICD-10-CM | POA: Diagnosis not present

## 2022-08-22 DIAGNOSIS — I5020 Unspecified systolic (congestive) heart failure: Secondary | ICD-10-CM | POA: Diagnosis not present

## 2022-08-28 DIAGNOSIS — K219 Gastro-esophageal reflux disease without esophagitis: Secondary | ICD-10-CM | POA: Diagnosis not present

## 2022-08-28 DIAGNOSIS — M1990 Unspecified osteoarthritis, unspecified site: Secondary | ICD-10-CM | POA: Diagnosis not present

## 2022-08-28 DIAGNOSIS — Z79899 Other long term (current) drug therapy: Secondary | ICD-10-CM | POA: Diagnosis not present

## 2022-08-28 DIAGNOSIS — Z01818 Encounter for other preprocedural examination: Secondary | ICD-10-CM | POA: Diagnosis not present

## 2022-08-28 DIAGNOSIS — F17200 Nicotine dependence, unspecified, uncomplicated: Secondary | ICD-10-CM | POA: Diagnosis not present

## 2022-08-28 DIAGNOSIS — Z8711 Personal history of peptic ulcer disease: Secondary | ICD-10-CM | POA: Diagnosis not present

## 2022-08-28 DIAGNOSIS — Z7982 Long term (current) use of aspirin: Secondary | ICD-10-CM | POA: Diagnosis not present

## 2022-09-01 DIAGNOSIS — I447 Left bundle-branch block, unspecified: Secondary | ICD-10-CM | POA: Diagnosis not present

## 2022-09-01 DIAGNOSIS — I5022 Chronic systolic (congestive) heart failure: Secondary | ICD-10-CM | POA: Diagnosis not present

## 2022-09-01 DIAGNOSIS — R0602 Shortness of breath: Secondary | ICD-10-CM | POA: Diagnosis not present

## 2022-09-04 ENCOUNTER — Other Ambulatory Visit: Payer: Self-pay

## 2022-09-04 ENCOUNTER — Ambulatory Visit: Payer: 59 | Attending: Obstetrics and Gynecology

## 2022-09-04 DIAGNOSIS — R159 Full incontinence of feces: Secondary | ICD-10-CM | POA: Diagnosis present

## 2022-09-04 DIAGNOSIS — N393 Stress incontinence (female) (male): Secondary | ICD-10-CM

## 2022-09-04 DIAGNOSIS — R279 Unspecified lack of coordination: Secondary | ICD-10-CM | POA: Insufficient documentation

## 2022-09-04 DIAGNOSIS — R293 Abnormal posture: Secondary | ICD-10-CM | POA: Insufficient documentation

## 2022-09-04 DIAGNOSIS — R351 Nocturia: Secondary | ICD-10-CM

## 2022-09-04 DIAGNOSIS — M6281 Muscle weakness (generalized): Secondary | ICD-10-CM | POA: Insufficient documentation

## 2022-09-04 NOTE — Patient Instructions (Signed)
Squatty potty: When your knees are level or below the level of your hips, pelvic floor muscles are pressed against rectum, preventing ease of bowel movement. By getting knees above the level of the hips, these pelvic floor muscles relax, allowing easier passage of bowel movement. ? Ways to get knees above hips: o Squatty Potty (7inch and 9inch versions) o Small stool o Roll of toilet paper under each foot o Hardback book or stack of magazines under each foot  Relaxed Toileting mechanics: Once in this position, make sure to lean forward with forearms on thighs, wide knees, relaxed stomach, and breathe.   Balloon Breathing  Fiber supplement: psyllium husk    Double-voiding:  This technique is to help with post-void dribbling, or leaking a little bit when you stand up right after urinating. Use relaxed toileting mechanics to urinate as much as you feel like you have to without straining. Sit back upright from leaning forward and relax this way for 10-20 seconds. Lean forward again to finish voiding any amount more.

## 2022-09-04 NOTE — Therapy (Signed)
OUTPATIENT PHYSICAL THERAPY FEMALE PELVIC EVALUATION   Patient Name: Jacqueline Alexander MRN: [id number] DOB:[birth year], 72 y.o., female Today's Date: 09/04/2022  END OF SESSION:  PT End of Session - 09/04/22 1354     Visit Number 1    Date for PT Re-Evaluation 02/19/23    Authorization Type [employer]    PT Start Time 1400    PT Stop Time 1445    PT Time Calculation (min) 45 min    Activity Tolerance Patient tolerated treatment well    Behavior During Therapy [facility] for tasks assessed/performed             Past Medical History:  Diagnosis Date   Anxiety    Arthritis    Barrett esophagus    Chronic diarrhea    Colon polyps    Depression    Diabetes mellitus without complication    Family history of ovarian cancer 12/11/2021   Family history of stomach cancer 12/11/2021   GERD (gastroesophageal reflux disease)    Hyperlipemia    Hypertension    Insomnia    Lumbar radiculitis    Mitral insufficiency    Restless leg    Restless leg syndrome    Sensory urge incontinence    Past Surgical History:  Procedure Laterality Date   CARPAL TUNNEL RELEASE Left    CHOLECYSTECTOMY     COLONOSCOPY N/A 11/10/2021   Procedure: COLONOSCOPY;  Surgeon: Jaynie Collins, DO;  Location: [facility] ENDOSCOPY;  Service: Gastroenterology;  Laterality: N/A;   ESOPHAGOGASTRODUODENOSCOPY N/A 11/10/2021   Procedure: ESOPHAGOGASTRODUODENOSCOPY (EGD);  Surgeon: Jaynie Collins, DO;  Location: [facility] ENDOSCOPY;  Service: Gastroenterology;  Laterality: N/A;  DM   ESOPHAGOGASTRODUODENOSCOPY (EGD) WITH PROPOFOL N/A 08/06/2019   Procedure: ESOPHAGOGASTRODUODENOSCOPY (EGD) WITH PROPOFOL;  Surgeon: Toledo, Boykin Nearing, MD;  Location: [facility] ENDOSCOPY;  Service: Gastroenterology;  Laterality: N/A;   HERNIA REPAIR     INCONTINENCE SURGERY     sling   OOPHORECTOMY     TONSILLECTOMY     VAGINAL HYSTERECTOMY     VAGINAL PROLAPSE REPAIR     Patient Active Problem List   Diagnosis Date Noted   Genetic testing  12/28/2021   Family history of ovarian cancer 12/11/2021   Family history of stomach cancer 12/11/2021   Iron deficiency anemia 10/31/2021    PCP: Ailene Ravel, MD  REFERRING PROVIDER: Marguerita Beards, MD  REFERRING DIAG: N32.81 (ICD-10-CM) - Overactive bladder R15.9 (ICD-10-CM) - Incontinence of feces, unspecified fecal incontinence type  THERAPY DIAG:  Abnormal posture  Muscle weakness (generalized)  Unspecified lack of coordination  Nocturia  Complete fecal incontinence  Urinary, incontinence, stress female  Rationale for Evaluation and Treatment: Rehabilitation  ONSET DATE: 15 years   SUBJECTIVE:  EVAL SUBJECTIVE STATEMENT: Pt states that bladder difficulties after she started going through menopause. She started having trouble with bowels after hysterectomy/bladder tack. She is using clobestasol, estrogen, and coconut oil.  Fluid intake: Yes: 1 cup of coffee in am, 3 16 oz bottles of water, one bottle of soda    PAIN:  Are you having pain? No   PRECAUTIONS: None  WEIGHT BEARING RESTRICTIONS: No  FALLS:  Has patient fallen in last 6 months? No  LIVING ENVIRONMENT: Lives with: lives alone Lives in: House/apartment  OCCUPATION: retired  PLOF: Independent  PATIENT GOALS: decrease fecal incontinence   PERTINENT HISTORY:  Lichens sclerosis, bladder sling 2006, vaginal hysterectomy, hernia repair, cholecystectomy  Sexual abuse: No  BOWEL MOVEMENT: Pain with bowel movement: No Type of bowel movement:Frequency 2-4x/day and Strain Yes,  ut tries not to Fully empty rectum: No Leakage: Yes: a few times a day Pads: Yes: 1 a day, most of the time just one  Fiber supplement: Yes: occasionally  URINATION: Pain with urination: No Fully empty bladder: No, has to push  on bladder to empty Stream: Weak, difficulty starting Urgency: Yes: when she is around running water Frequency: 8x/day, 4x/night Leakage: Coughing, Sneezing, Laughing, and notices wetness when she wakes at night Pads: Yes: most of the time just one  INTERCOURSE: Pain with intercourse:  not sexually active, pain after menopause  PREGNANCY: Vaginal deliveries 3 Tearing Yes: sutures with her first C-section deliveries 0 Currently pregnant No  PROLAPSE: None   OBJECTIVE:  09/04/22: COGNITION: Overall cognitive status: Within functional limits for tasks assessed     SENSATION: Light touch: Appears intact Proprioception: Appears intact  GAIT: Comments: WNL  POSTURE: rounded shoulders, forward head, decreased lumbar lordosis, increased thoracic kyphosis, and posterior pelvic tilt  PALPATION:   General  no abdominal tenderness; appears to be very weak with oblique dominance                External Perineal Exam significant redness; active lichens sclerosis plaques; fusion of labia minor superiorly and phimosis                             Internal Pelvic Floor dry and very tender with sensation of ripping  Patient confirms identification and approves PT to assess internal pelvic floor and treatment Yes - just vaginal assessment at this time, discussed benefit of performing rectal exam in future sessions  PELVIC MMT:   MMT eval  Vaginal 1/5, 4 second endurance, no repeat contractions; poor coordination with tendency to bear down and push with contraction and difficulty correcting - trying to perform with breathing at this time makes this worse  Internal Anal Sphincter   External Anal Sphincter   Puborectalis   Diastasis Recti 2 finger width separation with distortion in upper abdominals   (Blank rows = not tested)        TONE: low  PROLAPSE: Mild anterior vaginal wall laxity  TODAY'S TREATMENT:  DATE:  09/04/22  EVAL  Neuromuscular re-education: Pelvic floor contraction training Quick flicks Long holds Therapeutic activities: Fiber supplement Squatty potty/relaxed toilet mechanics  Double voiding     PATIENT EDUCATION:  Education details: See above Person educated: Patient Education method: Explanation, Demonstration, Tactile cues, Verbal cues, and Handouts Education comprehension: verbalized understanding  HOME EXERCISE PROGRAM: [id number]  ASSESSMENT:  CLINICAL IMPRESSION: Patient is a 72 y.o. female who was seen today for physical therapy evaluation and treatment for fecal incontinence, urinary incontinence, difficulty emptying bladder, and nocturia. Exam findings notable for abnormal posture, core weakness with oblique dominance, pelvic floor weakness, significant difficulty with pelvic floor contraction with tendency to bear down and push, decreased pelvic floor muscle endurance, poor skin condition vulva causing significant discomfort externally and in superficial pelvic floor, and mild anterior vaginal wall laxity; we discussed benefit of rectal exam and will plan to perform in future treatment session in order to better treat fecal incontinence and determine why pt is not completely emptying. Signs and symptoms are most consistent with pelvic floor weakness, poor coordination, and skin/mucosa dryness/irritation. She will benefit from skilled PT intervention in order to decrease fecal/urinary incontinence, improve nocturia, strengthen pelvic floor, and improve quality of life.   OBJECTIVE IMPAIRMENTS: decreased activity tolerance, decreased coordination, decreased endurance, decreased strength, increased fascial restrictions, increased muscle spasms, impaired tone, postural dysfunction, and pain.   ACTIVITY LIMITATIONS: continence  PARTICIPATION LIMITATIONS: community activity  PERSONAL FACTORS: 1 comorbidity: medical  history  are also affecting patient's functional outcome.   REHAB POTENTIAL: Good  CLINICAL DECISION MAKING: Stable/uncomplicated  EVALUATION COMPLEXITY: Low   GOALS: Goals reviewed with patient? Yes  SHORT TERM GOALS: Target date: 10/16/22  Pt will be independent with HEP.   Baseline: Goal status: INITIAL  2.  Pt will be independent with the knack, urge suppression technique, and double voiding in order to improve bladder habits and decrease urinary incontinence.   Baseline:  Goal status: INITIAL  3.  Pt will be able to correctly perform diaphragmatic breathing and appropriate pressure management in order to prevent worsening vaginal wall laxity and improve pelvic floor A/ROM.   Baseline:  Goal status: INITIAL  4.  Pt will be independent with diaphragmatic breathing and down training activities in order to improve pelvic floor relaxation.  Baseline:  Goal status: INITIAL   LONG TERM GOALS: Target date: 02/19/23  Pt will be independent with advanced HEP.   Baseline:  Goal status: INITIAL  2.  Pt will decrease frequency of nightly trips to the bathroom to 1 or less in order to get restful sleep.   Baseline:  Goal status: INITIAL  3.  Pt will report no episodes of urinary or fecal incontinence in order to improve confidence in community activities and personal hygiene.   Baseline:  Goal status: INITIAL  4.  Pt will report no leaks with laughing, coughing, sneezing in order to improve comfort with interpersonal relationships and community activities.   Baseline:  Goal status: INITIAL  5.  Pt will demonstrate normal pelvic floor muscle tone and A/ROM, able to achieve 3/5 strength with contractions and 10 sec endurance, in order to provide appropriate lumbopelvic support in functional activities.   Baseline:  Goal status: INITIAL  6.  Pt will have 1-2 daily bowel movements without straining. Baseline:  Goal status: INITIAL  PLAN:  PT FREQUENCY:  1-2x/week  PT DURATION: 6 months  PLANNED INTERVENTIONS: Therapeutic exercises, Therapeutic activity, Neuromuscular re-education, Balance training, Gait training, Patient/Family education, Self Care, Joint  mobilization, Dry Needling, Biofeedback, and Manual therapy  PLAN FOR NEXT SESSION: Possible rectal exam of pelvic floor muscles and external anal sphincter; progress pelvic floor strengthening; begin core training.    Julio Alm, PT, DPT04/22/242:51 PM

## 2022-09-11 ENCOUNTER — Ambulatory Visit: Payer: 59

## 2022-09-11 DIAGNOSIS — R351 Nocturia: Secondary | ICD-10-CM

## 2022-09-11 DIAGNOSIS — R279 Unspecified lack of coordination: Secondary | ICD-10-CM | POA: Diagnosis not present

## 2022-09-11 DIAGNOSIS — R293 Abnormal posture: Secondary | ICD-10-CM | POA: Diagnosis not present

## 2022-09-11 DIAGNOSIS — N393 Stress incontinence (female) (male): Secondary | ICD-10-CM

## 2022-09-11 DIAGNOSIS — R159 Full incontinence of feces: Secondary | ICD-10-CM

## 2022-09-11 DIAGNOSIS — M6281 Muscle weakness (generalized): Secondary | ICD-10-CM

## 2022-09-11 NOTE — Therapy (Signed)
OUTPATIENT PHYSICAL THERAPY TREATMENT NOTE   Patient Name: SMT LOKEY MRN: [id number] DOB:1950-11-10, 72 y.o., female Today's Date: 09/11/2022  PCP: Ailene Ravel, MD REFERRING PROVIDER: Marguerita Beards, MD  END OF SESSION:   PT End of Session - 09/11/22 1149     Visit Number 2    Date for PT Re-Evaluation 02/19/23    Authorization Type [employer]    Progress Note Due on Visit 10    PT Start Time 1148    PT Stop Time 1227    PT Time Calculation (min) 39 min    Activity Tolerance Patient tolerated treatment well    Behavior During Therapy [facility] for tasks assessed/performed             Past Medical History:  Diagnosis Date   Anxiety    Arthritis    Barrett esophagus    Chronic diarrhea    Colon polyps    Depression    Diabetes mellitus without complication (HCC)    Family history of ovarian cancer 12/11/2021   Family history of stomach cancer 12/11/2021   GERD (gastroesophageal reflux disease)    Hyperlipemia    Hypertension    Insomnia    Lumbar radiculitis    Mitral insufficiency    Restless leg    Restless leg syndrome    Sensory urge incontinence    Past Surgical History:  Procedure Laterality Date   CARPAL TUNNEL RELEASE Left    CHOLECYSTECTOMY     COLONOSCOPY N/A 11/10/2021   Procedure: COLONOSCOPY;  Surgeon: Jaynie Collins, DO;  Location: [facility] ENDOSCOPY;  Service: Gastroenterology;  Laterality: N/A;   ESOPHAGOGASTRODUODENOSCOPY N/A 11/10/2021   Procedure: ESOPHAGOGASTRODUODENOSCOPY (EGD);  Surgeon: Jaynie Collins, DO;  Location: [facility] ENDOSCOPY;  Service: Gastroenterology;  Laterality: N/A;  DM   ESOPHAGOGASTRODUODENOSCOPY (EGD) WITH PROPOFOL N/A 08/06/2019   Procedure: ESOPHAGOGASTRODUODENOSCOPY (EGD) WITH PROPOFOL;  Surgeon: Toledo, Boykin Nearing, MD;  Location: [facility] ENDOSCOPY;  Service: Gastroenterology;  Laterality: N/A;   HERNIA REPAIR     INCONTINENCE SURGERY     sling   OOPHORECTOMY     TONSILLECTOMY     VAGINAL HYSTERECTOMY      VAGINAL PROLAPSE REPAIR     Patient Active Problem List   Diagnosis Date Noted   Genetic testing 12/28/2021   Family history of ovarian cancer 12/11/2021   Family history of stomach cancer 12/11/2021   Iron deficiency anemia 10/31/2021    REFERRING DIAG: N32.81 (ICD-10-CM) - Overactive bladder R15.9 (ICD-10-CM) - Incontinence of feces, unspecified fecal incontinence type  THERAPY DIAG:  Abnormal posture  Muscle weakness (generalized)  Unspecified lack of coordination  Nocturia  Complete fecal incontinence  Urinary, incontinence, stress female  Rationale for Evaluation and Treatment Rehabilitation  PERTINENT HISTORY: Lichens sclerosis, bladder sling 2006, vaginal hysterectomy, hernia repair, cholecystectomy   PRECAUTIONS: NA  SUBJECTIVE:  SUBJECTIVE STATEMENT:  Pt has gotten fiber and has started taking it. She is taking very little as of right now. She is having significant difficulty with bowel movements and not emptying today. She did take immodium over the weekend and is wondering if this is what has slowed things down.    PAIN:  Are you having pain? No   EVAL SUBJECTIVE STATEMENT: Pt states that bladder difficulties after she started going through menopause. She started having trouble with bowels after hysterectomy/bladder tack. She is using clobestasol, estrogen, and coconut oil.  Fluid intake: Yes: 1 cup of coffee in am, 3 16 oz bottles of water, one bottle of soda   PAIN:  Are you having pain? No   PRECAUTIONS: None  WEIGHT BEARING RESTRICTIONS: No  FALLS:  Has patient fallen in last 6 months? No  LIVING ENVIRONMENT: Lives with: lives alone Lives in: House/apartment  OCCUPATION: retired  PLOF: Independent  PATIENT GOALS: decrease fecal incontinence   PERTINENT  HISTORY:  Lichens sclerosis, bladder sling 2006, vaginal hysterectomy, hernia repair, cholecystectomy  Sexual abuse: No  BOWEL MOVEMENT: Pain with bowel movement: No Type of bowel movement:Frequency 2-4x/day and Strain Yes,  but tries not to Fully empty rectum: No Leakage: Yes: a few times a day Pads: Yes: 1 a day, most of the time just one  Fiber supplement: Yes: occasionally  URINATION: Pain with urination: No Fully empty bladder: No, has to push on bladder to empty Stream: Weak, difficulty starting Urgency: Yes: when she is around running water Frequency: 8x/day, 4x/night Leakage: Coughing, Sneezing, Laughing, and notices wetness when she wakes at night Pads: Yes: most of the time just one  INTERCOURSE: Pain with intercourse: not sexually active, pain after menopause  PREGNANCY: Vaginal deliveries 3 Tearing Yes: sutures with her first C-section deliveries 0 Currently pregnant No  PROLAPSE: None   OBJECTIVE:  09/04/22: COGNITION: Overall cognitive status: Within functional limits for tasks assessed     SENSATION: Light touch: Appears intact Proprioception: Appears intact  GAIT: Comments: WNL  POSTURE: rounded shoulders, forward head, decreased lumbar lordosis, increased thoracic kyphosis, and posterior pelvic tilt  PALPATION:   General  no abdominal tenderness; appears to be very weak with oblique dominance                External Perineal Exam significant redness; active lichens sclerosis plaques; fusion of labia minor superiorly and phimosis                             Internal Pelvic Floor dry and very tender with sensation of ripping  Patient confirms identification and approves PT to assess internal pelvic floor and treatment Yes - just vaginal assessment at this time, discussed benefit of performing rectal exam in future sessions  PELVIC MMT:   MMT eval  Vaginal 1/5, 4 second endurance, no repeat contractions; poor coordination with tendency to bear  down and push with contraction and difficulty correcting - trying to perform with breathing at this time makes this worse  Internal Anal Sphincter   External Anal Sphincter   Puborectalis   Diastasis Recti 2 finger width separation with distortion in upper abdominals   (Blank rows = not tested)        TONE: low  PROLAPSE: Mild anterior vaginal wall laxity  TODAY'S TREATMENT:  DATE:  09/11/22 Manual: Bowel mobilization Exercises: Lower trunk rotation 3 x 10 Supine piriformis stretch 2 min bil Bridge with hip adduction 12x Open books 10x bil Therapeutic activities: Fiber supplement - regular schedule Self-bowel massage   09/04/22  EVAL  Neuromuscular re-education: Pelvic floor contraction training Quick flicks Long holds Therapeutic activities: Fiber supplement Squatty potty/relaxed toilet mechanics  Double voiding     PATIENT EDUCATION:  Education details: See above Person educated: Patient Education method: Programmer, multimedia, Demonstration, Tactile cues, Verbal cues, and Handouts Education comprehension: verbalized understanding  HOME EXERCISE PROGRAM: [id number]  ASSESSMENT:  CLINICAL IMPRESSION: Pt has not seen progress in condition yet and had difficult week with bowel movements, but has been working on pelvic floor strengthening program. We discussed being consistent with the fiber. We reviewed relaxed toilet mechanics. She tolerated bowel mobilization well and she reports feeling comfortable attempting at home. She did well with mobility exercises and starting gentle hip strengthening. She will benefit from skilled PT intervention in order to decrease fecal/urinary incontinence, improve nocturia, strengthen pelvic floor, and improve quality of life.   OBJECTIVE IMPAIRMENTS: decreased activity tolerance, decreased coordination, decreased  endurance, decreased strength, increased fascial restrictions, increased muscle spasms, impaired tone, postural dysfunction, and pain.   ACTIVITY LIMITATIONS: continence  PARTICIPATION LIMITATIONS: community activity  PERSONAL FACTORS: 1 comorbidity: medical history are also affecting patient's functional outcome.   REHAB POTENTIAL: Good  CLINICAL DECISION MAKING: Stable/uncomplicated  EVALUATION COMPLEXITY: Low   GOALS: Goals reviewed with patient? Yes  SHORT TERM GOALS: Target date: 10/16/22  Pt will be independent with HEP.   Baseline: Goal status: INITIAL  2.  Pt will be independent with the knack, urge suppression technique, and double voiding in order to improve bladder habits and decrease urinary incontinence.   Baseline:  Goal status: INITIAL  3.  Pt will be able to correctly perform diaphragmatic breathing and appropriate pressure management in order to prevent worsening vaginal wall laxity and improve pelvic floor A/ROM.   Baseline:  Goal status: INITIAL  4.  Pt will be independent with diaphragmatic breathing and down training activities in order to improve pelvic floor relaxation.  Baseline:  Goal status: INITIAL   LONG TERM GOALS: Target date: 02/19/23  Pt will be independent with advanced HEP.   Baseline:  Goal status: INITIAL  2.  Pt will decrease frequency of nightly trips to the bathroom to 1 or less in order to get restful sleep.   Baseline:  Goal status: INITIAL  3.  Pt will report no episodes of urinary or fecal incontinence in order to improve confidence in community activities and personal hygiene.   Baseline:  Goal status: INITIAL  4.  Pt will report no leaks with laughing, coughing, sneezing in order to improve comfort with interpersonal relationships and community activities.   Baseline:  Goal status: INITIAL  5.  Pt will demonstrate normal pelvic floor muscle tone and A/ROM, able to achieve 3/5 strength with contractions and 10 sec  endurance, in order to provide appropriate lumbopelvic support in functional activities.   Baseline:  Goal status: INITIAL  6.  Pt will have 1-2 daily bowel movements without straining. Baseline:  Goal status: INITIAL  PLAN:  PT FREQUENCY: 1-2x/week  PT DURATION: 6 months  PLANNED INTERVENTIONS: Therapeutic exercises, Therapeutic activity, Neuromuscular re-education, Balance training, Gait training, Patient/Family education, Self Care, Joint mobilization, Dry Needling, Biofeedback, and Manual therapy  PLAN FOR NEXT SESSION: Continue to consider rectal exam; progress core/hip strengthening to tolerance.   Julio Alm,  PT, DPT04/29/2412:38 PM

## 2022-09-11 NOTE — Patient Instructions (Signed)
Bowel massage: To assist with more regular and more comfortable bowel movements, try performing bowel massage nightly for 5-10 minutes. Place hands in the lower right side of your abdomen to start; in small circles, massage up, across, and down the left side of your abdomen. Pressure does not need to be hard, but just comfortable. You can use lotion or oil to make more comfortable.    [facility] [address], [address] [place], [place] [address] Phone # [phone] Fax [phone]  

## 2022-09-20 ENCOUNTER — Ambulatory Visit: Payer: 59 | Attending: Obstetrics and Gynecology

## 2022-09-20 DIAGNOSIS — N393 Stress incontinence (female) (male): Secondary | ICD-10-CM | POA: Insufficient documentation

## 2022-09-20 DIAGNOSIS — R279 Unspecified lack of coordination: Secondary | ICD-10-CM | POA: Insufficient documentation

## 2022-09-20 DIAGNOSIS — M6281 Muscle weakness (generalized): Secondary | ICD-10-CM | POA: Insufficient documentation

## 2022-09-20 DIAGNOSIS — R293 Abnormal posture: Secondary | ICD-10-CM | POA: Insufficient documentation

## 2022-09-20 DIAGNOSIS — R159 Full incontinence of feces: Secondary | ICD-10-CM | POA: Diagnosis present

## 2022-09-20 DIAGNOSIS — R351 Nocturia: Secondary | ICD-10-CM | POA: Diagnosis present

## 2022-09-20 NOTE — Therapy (Signed)
OUTPATIENT PHYSICAL THERAPY TREATMENT NOTE   Patient Name: ELLOUISE MANKOWSKI MRN: [id number] DOB:1950/09/07, 72 y.o., female Today's Date: 09/20/2022  PCP: Ailene Ravel, MD REFERRING PROVIDER: Marguerita Beards, MD  END OF SESSION:   PT End of Session - 09/20/22 0801     Visit Number 3    Date for PT Re-Evaluation 02/19/23    Authorization Type [employer]    Progress Note Due on Visit 10    PT Start Time 0800    PT Stop Time 0840    PT Time Calculation (min) 40 min    Activity Tolerance Patient tolerated treatment well    Behavior During Therapy [facility] for tasks assessed/performed              Past Medical History:  Diagnosis Date   Anxiety    Arthritis    Barrett esophagus    Chronic diarrhea    Colon polyps    Depression    Diabetes mellitus without complication (HCC)    Family history of ovarian cancer 12/11/2021   Family history of stomach cancer 12/11/2021   GERD (gastroesophageal reflux disease)    Hyperlipemia    Hypertension    Insomnia    Lumbar radiculitis    Mitral insufficiency    Restless leg    Restless leg syndrome    Sensory urge incontinence    Past Surgical History:  Procedure Laterality Date   CARPAL TUNNEL RELEASE Left    CHOLECYSTECTOMY     COLONOSCOPY N/A 11/10/2021   Procedure: COLONOSCOPY;  Surgeon: Jaynie Collins, DO;  Location: [facility] ENDOSCOPY;  Service: Gastroenterology;  Laterality: N/A;   ESOPHAGOGASTRODUODENOSCOPY N/A 11/10/2021   Procedure: ESOPHAGOGASTRODUODENOSCOPY (EGD);  Surgeon: Jaynie Collins, DO;  Location: [facility] ENDOSCOPY;  Service: Gastroenterology;  Laterality: N/A;  DM   ESOPHAGOGASTRODUODENOSCOPY (EGD) WITH PROPOFOL N/A 08/06/2019   Procedure: ESOPHAGOGASTRODUODENOSCOPY (EGD) WITH PROPOFOL;  Surgeon: Toledo, Boykin Nearing, MD;  Location: [facility] ENDOSCOPY;  Service: Gastroenterology;  Laterality: N/A;   HERNIA REPAIR     INCONTINENCE SURGERY     sling   OOPHORECTOMY     TONSILLECTOMY     VAGINAL HYSTERECTOMY      VAGINAL PROLAPSE REPAIR     Patient Active Problem List   Diagnosis Date Noted   Genetic testing 12/28/2021   Family history of ovarian cancer 12/11/2021   Family history of stomach cancer 12/11/2021   Iron deficiency anemia 10/31/2021    REFERRING DIAG: N32.81 (ICD-10-CM) - Overactive bladder R15.9 (ICD-10-CM) - Incontinence of feces, unspecified fecal incontinence type  THERAPY DIAG:  Abnormal posture  Muscle weakness (generalized)  Unspecified lack of coordination  Nocturia  Complete fecal incontinence  Urinary, incontinence, stress female  Rationale for Evaluation and Treatment Rehabilitation  PERTINENT HISTORY: Lichens sclerosis, bladder sling 2006, vaginal hysterectomy, hernia repair, cholecystectomy   PRECAUTIONS: NA  SUBJECTIVE:  SUBJECTIVE STATEMENT:  Pt states that she is starting to notice a good improvement in symptoms. She is not having as many episodes of fecal incontinence each day, but still has 2-3 bowel movements a day. She is not getting up as much at night.    PAIN:  Are you having pain? No   EVAL SUBJECTIVE STATEMENT: Pt states that bladder difficulties after she started going through menopause. She started having trouble with bowels after hysterectomy/bladder tack. She is using clobestasol, estrogen, and coconut oil.  Fluid intake: Yes: 1 cup of coffee in am, 3 16 oz bottles of water, one bottle of soda   PAIN:  Are you having pain? No   PRECAUTIONS: None  WEIGHT BEARING RESTRICTIONS: No  FALLS:  Has patient fallen in last 6 months? No  LIVING ENVIRONMENT: Lives with: lives alone Lives in: House/apartment  OCCUPATION: retired  PLOF: Independent  PATIENT GOALS: decrease fecal incontinence   PERTINENT HISTORY:  Lichens sclerosis, bladder sling 2006,  vaginal hysterectomy, hernia repair, cholecystectomy  Sexual abuse: No  BOWEL MOVEMENT: Pain with bowel movement: No Type of bowel movement:Frequency 2-4x/day and Strain Yes,  but tries not to Fully empty rectum: No Leakage: Yes: a few times a day Pads: Yes: 1 a day, most of the time just one  Fiber supplement: Yes: occasionally  URINATION: Pain with urination: No Fully empty bladder: No, has to push on bladder to empty Stream: Weak, difficulty starting Urgency: Yes: when she is around running water Frequency: 8x/day, 4x/night Leakage: Coughing, Sneezing, Laughing, and notices wetness when she wakes at night Pads: Yes: most of the time just one  INTERCOURSE: Pain with intercourse: not sexually active, pain after menopause  PREGNANCY: Vaginal deliveries 3 Tearing Yes: sutures with her first C-section deliveries 0 Currently pregnant No  PROLAPSE: None   OBJECTIVE:  09/04/22: COGNITION: Overall cognitive status: Within functional limits for tasks assessed     SENSATION: Light touch: Appears intact Proprioception: Appears intact  GAIT: Comments: WNL  POSTURE: rounded shoulders, forward head, decreased lumbar lordosis, increased thoracic kyphosis, and posterior pelvic tilt  PALPATION:   General  no abdominal tenderness; appears to be very weak with oblique dominance                External Perineal Exam significant redness; active lichens sclerosis plaques; fusion of labia minor superiorly and phimosis                             Internal Pelvic Floor dry and very tender with sensation of ripping  Patient confirms identification and approves PT to assess internal pelvic floor and treatment Yes - just vaginal assessment at this time, discussed benefit of performing rectal exam in future sessions  PELVIC MMT:   MMT eval  Vaginal 1/5, 4 second endurance, no repeat contractions; poor coordination with tendency to bear down and push with contraction and difficulty  correcting - trying to perform with breathing at this time makes this worse  Internal Anal Sphincter   External Anal Sphincter   Puborectalis   Diastasis Recti 2 finger width separation with distortion in upper abdominals   (Blank rows = not tested)        TONE: low  PROLAPSE: Mild anterior vaginal wall laxity  TODAY'S TREATMENT:  DATE:  09/20/22 Neuromuscular re-education: Transversus abdominus training with multimodal cues for improved motor control and breath coordination Supine UE ball press10x Side lying UE ball press 10x bil Supine hip adduction ball press 10x Exercises: Lower trunk rotation 2 x 10 Bridge with hip adduction 2 x 10 Single knee to chest 5x bil    09/11/22 Manual: Bowel mobilization Exercises: Lower trunk rotation 3 x 10 Supine piriformis stretch 2 min bil Bridge with hip adduction 12x Open books 10x bil Therapeutic activities: Fiber supplement - regular schedule Self-bowel massage   09/04/22  EVAL  Neuromuscular re-education: Pelvic floor contraction training Quick flicks Long holds Therapeutic activities: Fiber supplement Squatty potty/relaxed toilet mechanics  Double voiding     PATIENT EDUCATION:  Education details: See above Person educated: Patient Education method: Explanation, Demonstration, Tactile cues, Verbal cues, and Handouts Education comprehension: verbalized understanding  HOME EXERCISE PROGRAM: [id number]  ASSESSMENT:  CLINICAL IMPRESSION: Pt is beginning to see some good progress with less fecal incontinence. She is also noticing less nocturia. She did very well with core training today to activate both transversus abdominus and pelvic floor; some difficulty with breath holding with these contractions, but she was able to correct with multimodal cues and repetitions of each exercise. She will  benefit from skilled PT intervention in order to decrease fecal/urinary incontinence, improve nocturia, strengthen pelvic floor, and improve quality of life.   OBJECTIVE IMPAIRMENTS: decreased activity tolerance, decreased coordination, decreased endurance, decreased strength, increased fascial restrictions, increased muscle spasms, impaired tone, postural dysfunction, and pain.   ACTIVITY LIMITATIONS: continence  PARTICIPATION LIMITATIONS: community activity  PERSONAL FACTORS: 1 comorbidity: medical history are also affecting patient's functional outcome.   REHAB POTENTIAL: Good  CLINICAL DECISION MAKING: Stable/uncomplicated  EVALUATION COMPLEXITY: Low   GOALS: Goals reviewed with patient? Yes  SHORT TERM GOALS: Target date: 10/16/22  Pt will be independent with HEP.   Baseline: Goal status: INITIAL  2.  Pt will be independent with the knack, urge suppression technique, and double voiding in order to improve bladder habits and decrease urinary incontinence.   Baseline:  Goal status: INITIAL  3.  Pt will be able to correctly perform diaphragmatic breathing and appropriate pressure management in order to prevent worsening vaginal wall laxity and improve pelvic floor A/ROM.   Baseline:  Goal status: INITIAL  4.  Pt will be independent with diaphragmatic breathing and down training activities in order to improve pelvic floor relaxation.  Baseline:  Goal status: INITIAL   LONG TERM GOALS: Target date: 02/19/23  Pt will be independent with advanced HEP.   Baseline:  Goal status: INITIAL  2.  Pt will decrease frequency of nightly trips to the bathroom to 1 or less in order to get restful sleep.   Baseline:  Goal status: INITIAL  3.  Pt will report no episodes of urinary or fecal incontinence in order to improve confidence in community activities and personal hygiene.   Baseline:  Goal status: INITIAL  4.  Pt will report no leaks with laughing, coughing, sneezing in  order to improve comfort with interpersonal relationships and community activities.   Baseline:  Goal status: INITIAL  5.  Pt will demonstrate normal pelvic floor muscle tone and A/ROM, able to achieve 3/5 strength with contractions and 10 sec endurance, in order to provide appropriate lumbopelvic support in functional activities.   Baseline:  Goal status: INITIAL  6.  Pt will have 1-2 daily bowel movements without straining. Baseline:  Goal status: INITIAL  PLAN:  PT FREQUENCY: 1-2x/week  PT DURATION: 6 months  PLANNED INTERVENTIONS: Therapeutic exercises, Therapeutic activity, Neuromuscular re-education, Balance training, Gait training, Patient/Family education, Self Care, Joint mobilization, Dry Needling, Biofeedback, and Manual therapy  PLAN FOR NEXT SESSION: Rectal exam most likely not needed at this point - will continue to progress core/hip strengthening with appropriate breath coordination/pressure management.   Julio Alm, PT, DPT05/08/248:36 AM

## 2022-09-27 ENCOUNTER — Other Ambulatory Visit: Payer: Self-pay | Admitting: Hematology and Oncology

## 2022-09-27 DIAGNOSIS — D509 Iron deficiency anemia, unspecified: Secondary | ICD-10-CM

## 2022-09-27 NOTE — Progress Notes (Signed)
[facility]  [address] [place],  [place]  [address] [phone]  Clinic Day:  09/28/2022  Referring physician: Ailene Ravel, MD  ASSESSMENT & PLAN:   Assessment & Plan: Iron deficiency anemia Iron deficiency anemia diagnosed in May.  She did not tolerate oral iron supplementation.  She received IV iron in the form of Feraheme in June.  She underwent EGD and colonoscopy at the end of June and no source of bleeding was found.  She had removal of stomach polyps, which were benign.  She was found to have Barrett's esophagus.  She also had removal of colon polyps, which were hyperplastic polyps. She was seen at [facility].  Repeat EGD.  This revealed more advanced changes of Barrett's, so endoscopic submucosal dissection was scheduled, but not done due to EKG abnormalities.  She has had further cardiac evaluation and states her surgery is scheduled.  She continues to follow with a cardiologist and due to the left bundle branch block may need a defibrillator placed soon.  Her hemoglobin remains normal and iron studies are adequate, so we will continue to monitor her.  I will plan to see her back in 3 months for repeat clinical assessment.    The patient understands the plans discussed today and is in agreement with them.  She knows to contact our office if she develops concerns prior to her next appointment.   I provided 15 minutes of face-to-face time during this encounter and > 50% was spent counseling as documented under my assessment and plan.    Jacqueline Perl, PA-C  [facility] [address] [place] [address] Dept: [phone] Dept Fax: [phone]   No orders of the defined types were placed in this encounter.     CHIEF COMPLAINT:  CC: Iron deficiency anemia  Current Treatment:  Surveillance  HISTORY OF PRESENT ILLNESS:  Jacqueline Alexander is a 72 y.o. female with iron  deficiency anemia who I began seeing in June 2023.  The patient had evidence of iron deficiency dating back to June 2022. She does not tolerate oral iron due to constipation, so was referred to [place] for IV iron.  She received IV Feraheme in June with normalization of her hemoglobin and iron stores.  She has not had recurrent anemia.  She was taking oral B12 and her B12 level was elevated in November, so we had her discontinue B12 supplement.  She has a history of Barrett's esophagus and her last EGD was in March 2021 at [facility] in [place].  This revealed persistent Barrett's disease. She continues pantoprazole 20 mg daily.  Her last colonoscopy was in January 2018 at [facility].  This was normal, so follow-up in 10 years was recommended.  The patient has never had gastric surgery.  She underwent total hysterectomy and bilateral salpingo-oophorectomy with bladder tacking at age 78 for bladder and uterine prolapse and denies vaginal bleeding. She is on aspirin 81 mg daily.  She denied heavy alcohol use, but does drink socially.    She was referred to [facility] for follow-up of her Barrett's disease and was seen in January.  EGD revealed more advanced changes of Barrett's, so endoscopic submucosal dissection was scheduled.  This procedure was not done due to findings of a left bundle branch block preop EKG.  She saw her cardiologist and had an echocardiogram.  INTERVAL HISTORY:  Jacqueline Alexander is here today for repeat  clinical assessment.  She reports persistent fatigue, despite a normal hemoglobin.  She reports lack of motivation.  She states that her insurance plan has set her up for 8 weeks of virtual counseling for depression.  She denies shortness of breath, chest pain, palpitations or lightheadedness.  She denies fevers or chills. She reports hand and shoulder pain, which she attributes to arthritis. Her appetite is good. Her weight has increased 3 pounds over last 3 months .  Cardiac catheterization  was recommended after the echocardiogram and revealed minimal nonobstructive coronary artery disease.  She is now scheduled for endoscopic submucosal dissection on May 23. She states she may need a defibrillator placed in the near future because of the heart block.  REVIEW OF SYSTEMS:  Review of Systems  Constitutional:  Negative for appetite change, chills, fatigue, fever and unexpected weight change.  HENT:   Negative for lump/mass, mouth sores and sore throat.   Respiratory:  Negative for cough and shortness of breath.   Cardiovascular:  Negative for chest pain and leg swelling.  Gastrointestinal:  Negative for abdominal pain, constipation, diarrhea, nausea and vomiting.  Endocrine: Negative for hot flashes.  Genitourinary:  Negative for difficulty urinating, dysuria, frequency and hematuria.   Musculoskeletal:  Negative for arthralgias, back pain and myalgias.  Skin:  Negative for rash.  Neurological:  Negative for dizziness and headaches.  Hematological:  Negative for adenopathy. Does not bruise/bleed easily.  Psychiatric/Behavioral:  Negative for depression and sleep disturbance. The patient is not nervous/anxious.      VITALS:  Blood pressure (!) 95/56, pulse 76, temperature 98.7 F (37.1 C), temperature source Oral, resp. rate 20, height 5' 3.7" (1.618 m), weight 155 lb 3.2 oz (70.4 kg), SpO2 96 %.  Wt Readings from Last 3 Encounters:  09/28/22 155 lb 3.2 oz (70.4 kg)  07/18/22 155 lb 12.8 oz (70.7 kg)  06/30/22 160 lb (72.6 kg)    Body mass index is 26.89 kg/m.  Performance status (ECOG): 1 - Symptomatic but completely ambulatory  PHYSICAL EXAM:  Physical Exam Vitals and nursing note reviewed.  Constitutional:      General: She is not in acute distress.    Appearance: Normal appearance.  HENT:     Head: Normocephalic and atraumatic.     Mouth/Throat:     Mouth: Mucous membranes are moist.     Pharynx: Oropharynx is clear. No oropharyngeal exudate or posterior  oropharyngeal erythema.  Eyes:     General: No scleral icterus.    Extraocular Movements: Extraocular movements intact.     Conjunctiva/sclera: Conjunctivae normal.     Pupils: Pupils are equal, round, and reactive to light.  Cardiovascular:     Rate and Rhythm: Normal rate and regular rhythm.     Heart sounds: Normal heart sounds. No murmur heard.    No friction rub. No gallop.  Pulmonary:     Effort: Pulmonary effort is normal.     Breath sounds: Normal breath sounds. No wheezing, rhonchi or rales.  Abdominal:     General: There is no distension.     Palpations: Abdomen is soft. There is no hepatomegaly, splenomegaly or mass.     Tenderness: There is no abdominal tenderness.  Musculoskeletal:        General: Normal range of motion.     Cervical back: Normal range of motion and neck supple. No tenderness.     Right lower leg: No edema.     Left lower leg: No edema.  Lymphadenopathy:  Cervical: No cervical adenopathy.     Upper Body:     Right upper body: No supraclavicular or axillary adenopathy.     Left upper body: No supraclavicular or axillary adenopathy.     Lower Body: No right inguinal adenopathy. No left inguinal adenopathy.  Skin:    General: Skin is warm and dry.     Coloration: Skin is not jaundiced.     Findings: No rash.  Neurological:     Mental Status: She is alert and oriented to person, place, and time.     Cranial Nerves: No cranial nerve deficit.  Psychiatric:        Mood and Affect: Mood normal.        Behavior: Behavior normal.        Thought Content: Thought content normal.     LABS:      Latest Ref Rng & Units 09/28/2022   10:21 AM 06/30/2022   10:02 AM 03/30/2022   11:04 AM  CBC  WBC 4.0 - 10.5 K/uL 8.9  9.4  9.8   Hemoglobin 12.0 - 15.0 g/dL 40.9  81.1  91.4   Hematocrit 36.0 - 46.0 % 38.3  42.6  42.1   Platelets 150 - 400 K/uL 293  336  326       Latest Ref Rng & Units 10/28/2021   12:00 AM  CMP  BUN 4 - 21 16      Creatinine 0.5  - 1.1 0.6      Sodium 137 - 147 140      Potassium 3.5 - 5.1 mEq/L 4.1      Chloride 99 - 108 103      CO2 13 - 22 27      Calcium 8.7 - 10.7 9.3      Alkaline Phos 25 - 125 64      AST 13 - 35 27      ALT 7 - 35 U/L 14         This result is from an external source.     Lab Results  Component Value Date   TIBC 370 09/28/2022   TIBC 367 06/30/2022   TIBC 383 03/30/2022   FERRITIN 24 09/28/2022   FERRITIN 21 06/30/2022   FERRITIN 49 03/30/2022   IRONPCTSAT 20 09/28/2022   IRONPCTSAT 23 06/30/2022   IRONPCTSAT 31 03/30/2022   No results found for: "LDH"  STUDIES:  No results found.    HISTORY:   Past Medical History:  Diagnosis Date   Anxiety    Arthritis    Barrett esophagus    Chronic diarrhea    Colon polyps    Depression    Diabetes mellitus without complication (HCC)    Family history of ovarian cancer 12/11/2021   Family history of stomach cancer 12/11/2021   GERD (gastroesophageal reflux disease)    Hyperlipemia    Hypertension    Insomnia    Lumbar radiculitis    Mitral insufficiency    Restless leg    Restless leg syndrome    Sensory urge incontinence     Past Surgical History:  Procedure Laterality Date   CARPAL TUNNEL RELEASE Left    CHOLECYSTECTOMY     COLONOSCOPY N/A 11/10/2021   Procedure: COLONOSCOPY;  Surgeon: Jaynie Collins, DO;  Location: [facility] ENDOSCOPY;  Service: Gastroenterology;  Laterality: N/A;   ESOPHAGOGASTRODUODENOSCOPY N/A 11/10/2021   Procedure: ESOPHAGOGASTRODUODENOSCOPY (EGD);  Surgeon: Jaynie Collins, DO;  Location: [facility] ENDOSCOPY;  Service: Gastroenterology;  Laterality:  N/A;  DM   ESOPHAGOGASTRODUODENOSCOPY (EGD) WITH PROPOFOL N/A 08/06/2019   Procedure: ESOPHAGOGASTRODUODENOSCOPY (EGD) WITH PROPOFOL;  Surgeon: Toledo, Boykin Nearing, MD;  Location: [facility] ENDOSCOPY;  Service: Gastroenterology;  Laterality: N/A;   HERNIA REPAIR     INCONTINENCE SURGERY     sling   OOPHORECTOMY     TONSILLECTOMY     VAGINAL  HYSTERECTOMY     VAGINAL PROLAPSE REPAIR      Family History  Problem Relation Age of Onset   Hypertension Mother    Arthritis Mother    Heart disease Mother    Skin cancer Mother        dx after 36   Hypertension Father    Tuberculosis Father    Emphysema Father    Heart attack Father    Hyperlipidemia Father    Hypertension Sister    Skin cancer Sister        dx after 64   Stomach cancer Maternal Uncle        dx late 26s   Lung cancer Paternal Aunt    Ovarian cancer Paternal Aunt        dx after 12   Ovarian cancer Paternal Grandmother        d. late 59s   Pancreatitis Niece        d. 18   Breast cancer Neg Hx     Social History:  reports that she quit smoking about 12 years ago. Her smoking use included cigarettes. She has a 40.00 pack-year smoking history. She has never used smokeless tobacco. She reports current alcohol use. She reports that she does not currently use drugs.The patient is alone today.  Allergies:  Allergies  Allergen Reactions   Morphine Nausea And Vomiting, Nausea Only and Other (See Comments)   Ace Inhibitors Other (See Comments)    Patient thinks she was light headed . She is not sure of the reaction   Ezetimibe Other (See Comments)    Leg pain    Current Medications: Current Outpatient Medications  Medication Sig Dispense Refill   candesartan (ATACAND) 4 MG tablet Take by mouth.     empagliflozin (JARDIANCE) 10 MG TABS tablet Take 1 tablet by mouth daily.     spironolactone (ALDACTONE) 25 MG tablet Take 1 tablet by mouth daily.     albuterol (VENTOLIN HFA) 108 (90 Base) MCG/ACT inhaler SMARTSIG:2 Puff(s) By Mouth Every 4-6 Hours PRN     aspirin EC 81 MG tablet Take 81 mg by mouth daily. Swallow whole.     BIOTIN PO Take 1 tablet by mouth daily.     carvedilol (COREG) 25 MG tablet Take by mouth.     cholecalciferol (VITAMIN D3) 10 MCG (400 UNIT) TABS tablet Take 1,000 Units by mouth.     Clobetasol Prop Emollient Base (CLOBETASOL  PROPIONATE E) 0.05 % emollient cream Apply 1 Application topically at bedtime. 30 g 11   Cysteamine Bitartrate (PROCYSBI) 300 MG PACK lancets 30 gauge     estradiol (ESTRACE) 0.1 MG/GM vaginal cream Place 0.5g nightly twice a week 30 g 11   FLUoxetine (PROZAC) 20 MG capsule      loratadine (CLARITIN) 10 MG tablet Take 10 mg by mouth daily.     LORazepam (ATIVAN) 0.5 MG tablet TK 1 T PO ONCE DAILY PRN (Patient not taking: Reported on 09/28/2022)  0   metFORMIN (GLUCOPHAGE-XR) 750 MG 24 hr tablet Take 750 mg by mouth 2 (two) times daily.     mometasone (ELOCON)  0.1 % cream Apply 1 Application topically daily.     Multiple Vitamins-Minerals (MULTIPLE VITAMINS/WOMENS PO) Take 1 tablet by mouth daily.     ONETOUCH VERIO test strip daily.     pantoprazole (PROTONIX) 40 MG tablet 40 mg 2 (two) times daily.     Probiotic Product (PROBIOTIC BLEND PO) Take 1 tablet by mouth daily.     rosuvastatin (CRESTOR) 5 MG tablet Take 5 mg by mouth daily.     vitamin B-12 (CYANOCOBALAMIN) 250 MCG tablet Take 2,500 mcg by mouth daily.     No current facility-administered medications for this visit.

## 2022-09-28 ENCOUNTER — Inpatient Hospital Stay: Payer: 59 | Attending: Hematology and Oncology

## 2022-09-28 ENCOUNTER — Encounter: Payer: Self-pay | Admitting: Hematology and Oncology

## 2022-09-28 ENCOUNTER — Ambulatory Visit: Payer: 59

## 2022-09-28 ENCOUNTER — Inpatient Hospital Stay: Payer: 59 | Admitting: Hematology and Oncology

## 2022-09-28 VITALS — BP 95/56 | HR 76 | Temp 98.7°F | Resp 20 | Ht 63.7 in | Wt 155.2 lb

## 2022-09-28 DIAGNOSIS — R351 Nocturia: Secondary | ICD-10-CM

## 2022-09-28 DIAGNOSIS — D509 Iron deficiency anemia, unspecified: Secondary | ICD-10-CM

## 2022-09-28 DIAGNOSIS — R279 Unspecified lack of coordination: Secondary | ICD-10-CM

## 2022-09-28 DIAGNOSIS — R293 Abnormal posture: Secondary | ICD-10-CM | POA: Diagnosis not present

## 2022-09-28 DIAGNOSIS — M6281 Muscle weakness (generalized): Secondary | ICD-10-CM | POA: Diagnosis not present

## 2022-09-28 DIAGNOSIS — R159 Full incontinence of feces: Secondary | ICD-10-CM

## 2022-09-28 DIAGNOSIS — N393 Stress incontinence (female) (male): Secondary | ICD-10-CM

## 2022-09-28 LAB — CBC WITH DIFFERENTIAL (CANCER CENTER ONLY)
Abs Immature Granulocytes: 0.02 10*3/uL (ref 0.00–0.07)
Basophils Absolute: 0.1 10*3/uL (ref 0.0–0.1)
Basophils Relative: 1 %
Eosinophils Absolute: 0.2 10*3/uL (ref 0.0–0.5)
Eosinophils Relative: 3 %
HCT: 38.3 % (ref 36.0–46.0)
Hemoglobin: 12.2 g/dL (ref 12.0–15.0)
Immature Granulocytes: 0 %
Lymphocytes Relative: 26 %
Lymphs Abs: 2.4 10*3/uL (ref 0.7–4.0)
MCH: 28.6 pg (ref 26.0–34.0)
MCHC: 31.9 g/dL (ref 30.0–36.0)
MCV: 89.9 fL (ref 80.0–100.0)
Monocytes Absolute: 0.8 10*3/uL (ref 0.1–1.0)
Monocytes Relative: 9 %
Neutro Abs: 5.5 10*3/uL (ref 1.7–7.7)
Neutrophils Relative %: 61 %
Platelet Count: 293 10*3/uL (ref 150–400)
RBC: 4.26 MIL/uL (ref 3.87–5.11)
RDW: 13.8 % (ref 11.5–15.5)
WBC Count: 8.9 10*3/uL (ref 4.0–10.5)
nRBC: 0 % (ref 0.0–0.2)

## 2022-09-28 LAB — FERRITIN: Ferritin: 24 ng/mL (ref 11–307)

## 2022-09-28 LAB — IRON AND TIBC
Iron: 74 ug/dL (ref 28–170)
Saturation Ratios: 20 % (ref 10.4–31.8)
TIBC: 370 ug/dL (ref 250–450)
UIBC: 296 ug/dL

## 2022-09-28 LAB — VITAMIN B12: Vitamin B-12: 298 pg/mL (ref 180–914)

## 2022-09-28 NOTE — Assessment & Plan Note (Addendum)
Iron deficiency anemia diagnosed in May.  She did not tolerate oral iron supplementation.  She received IV iron in the form of Feraheme in June.  She underwent EGD and colonoscopy at the end of June and no source of bleeding was found.  She had removal of stomach polyps, which were benign.  She was found to have Barrett's esophagus.  She also had removal of colon polyps, which were hyperplastic polyps. She was seen at [facility].  Repeat EGD.  This revealed more advanced changes of Barrett's, so endoscopic submucosal dissection was scheduled, but not done due to EKG abnormalities.  She has had further cardiac evaluation and states her surgery is scheduled.  She continues to follow with a cardiologist and due to the left bundle branch block may need a defibrillator placed soon.  Her hemoglobin remains normal and iron studies are adequate, so we will continue to monitor her.  I will plan to see her back in 3 months for repeat clinical assessment.

## 2022-09-28 NOTE — Therapy (Signed)
OUTPATIENT PHYSICAL THERAPY TREATMENT NOTE   Patient Name: Jacqueline Alexander MRN: [id number] DOB:Feb 08, 1951, 72 y.o., female Today's Date: 09/28/2022  PCP: Ailene Ravel, MD REFERRING PROVIDER: Marguerita Beards, MD  END OF SESSION:   PT End of Session - 09/28/22 1609     Visit Number 4    Date for PT Re-Evaluation 02/19/23    Authorization Type [employer]    Progress Note Due on Visit 10    PT Start Time 1612    PT Stop Time 1652    PT Time Calculation (min) 40 min    Activity Tolerance Patient tolerated treatment well    Behavior During Therapy [facility] for tasks assessed/performed              Past Medical History:  Diagnosis Date   Anxiety    Arthritis    Barrett esophagus    Chronic diarrhea    Colon polyps    Depression    Diabetes mellitus without complication (HCC)    Family history of ovarian cancer 12/11/2021   Family history of stomach cancer 12/11/2021   GERD (gastroesophageal reflux disease)    Hyperlipemia    Hypertension    Insomnia    Lumbar radiculitis    Mitral insufficiency    Restless leg    Restless leg syndrome    Sensory urge incontinence    Past Surgical History:  Procedure Laterality Date   CARPAL TUNNEL RELEASE Left    CHOLECYSTECTOMY     COLONOSCOPY N/A 11/10/2021   Procedure: COLONOSCOPY;  Surgeon: Jaynie Collins, DO;  Location: [facility] ENDOSCOPY;  Service: Gastroenterology;  Laterality: N/A;   ESOPHAGOGASTRODUODENOSCOPY N/A 11/10/2021   Procedure: ESOPHAGOGASTRODUODENOSCOPY (EGD);  Surgeon: Jaynie Collins, DO;  Location: [facility] ENDOSCOPY;  Service: Gastroenterology;  Laterality: N/A;  DM   ESOPHAGOGASTRODUODENOSCOPY (EGD) WITH PROPOFOL N/A 08/06/2019   Procedure: ESOPHAGOGASTRODUODENOSCOPY (EGD) WITH PROPOFOL;  Surgeon: Toledo, Boykin Nearing, MD;  Location: [facility] ENDOSCOPY;  Service: Gastroenterology;  Laterality: N/A;   HERNIA REPAIR     INCONTINENCE SURGERY     sling   OOPHORECTOMY     TONSILLECTOMY     VAGINAL HYSTERECTOMY      VAGINAL PROLAPSE REPAIR     Patient Active Problem List   Diagnosis Date Noted   Genetic testing 12/28/2021   Family history of ovarian cancer 12/11/2021   Family history of stomach cancer 12/11/2021   Iron deficiency anemia 10/31/2021    REFERRING DIAG: N32.81 (ICD-10-CM) - Overactive bladder R15.9 (ICD-10-CM) - Incontinence of feces, unspecified fecal incontinence type  THERAPY DIAG:  Abnormal posture  Muscle weakness (generalized)  Unspecified lack of coordination  Nocturia  Complete fecal incontinence  Urinary, incontinence, stress female  Rationale for Evaluation and Treatment Rehabilitation  PERTINENT HISTORY: Lichens sclerosis, bladder sling 2006, vaginal hysterectomy, hernia repair, cholecystectomy   PRECAUTIONS: NA  SUBJECTIVE:  SUBJECTIVE STATEMENT:  Pt states that episodes of fecal incontinence is about the same as last week. She is still having 2-3 bowel movements a week. She feels like she has not been eating well while on vacation this last week.    PAIN:  Are you having pain? No   EVAL SUBJECTIVE STATEMENT: Pt states that bladder difficulties after she started going through menopause. She started having trouble with bowels after hysterectomy/bladder tack. She is using clobestasol, estrogen, and coconut oil.  Fluid intake: Yes: 1 cup of coffee in am, 3 16 oz bottles of water, one bottle of soda   PAIN:  Are you having pain? No   PRECAUTIONS: None  WEIGHT BEARING RESTRICTIONS: No  FALLS:  Has patient fallen in last 6 months? No  LIVING ENVIRONMENT: Lives with: lives alone Lives in: House/apartment  OCCUPATION: retired  PLOF: Independent  PATIENT GOALS: decrease fecal incontinence   PERTINENT HISTORY:  Lichens sclerosis, bladder sling 2006, vaginal  hysterectomy, hernia repair, cholecystectomy  Sexual abuse: No  BOWEL MOVEMENT: Pain with bowel movement: No Type of bowel movement:Frequency 2-4x/day and Strain Yes,  but tries not to Fully empty rectum: No Leakage: Yes: a few times a day Pads: Yes: 1 a day, most of the time just one  Fiber supplement: Yes: occasionally  URINATION: Pain with urination: No Fully empty bladder: No, has to push on bladder to empty Stream: Weak, difficulty starting Urgency: Yes: when she is around running water Frequency: 8x/day, 4x/night Leakage: Coughing, Sneezing, Laughing, and notices wetness when she wakes at night Pads: Yes: most of the time just one  INTERCOURSE: Pain with intercourse: not sexually active, pain after menopause  PREGNANCY: Vaginal deliveries 3 Tearing Yes: sutures with her first C-section deliveries 0 Currently pregnant No  PROLAPSE: None   OBJECTIVE:  09/04/22: COGNITION: Overall cognitive status: Within functional limits for tasks assessed     SENSATION: Light touch: Appears intact Proprioception: Appears intact  GAIT: Comments: WNL  POSTURE: rounded shoulders, forward head, decreased lumbar lordosis, increased thoracic kyphosis, and posterior pelvic tilt  PALPATION:   General  no abdominal tenderness; appears to be very weak with oblique dominance                External Perineal Exam significant redness; active lichens sclerosis plaques; fusion of labia minor superiorly and phimosis                             Internal Pelvic Floor dry and very tender with sensation of ripping  Patient confirms identification and approves PT to assess internal pelvic floor and treatment Yes - just vaginal assessment at this time, discussed benefit of performing rectal exam in future sessions  PELVIC MMT:   MMT eval  Vaginal 1/5, 4 second endurance, no repeat contractions; poor coordination with tendency to bear down and push with contraction and difficulty correcting -  trying to perform with breathing at this time makes this worse  Internal Anal Sphincter   External Anal Sphincter   Puborectalis   Diastasis Recti 2 finger width separation with distortion in upper abdominals   (Blank rows = not tested)        TONE: low  PROLAPSE: Mild anterior vaginal wall laxity  TODAY'S TREATMENT:  DATE:  09/28/22 Neuromuscular re-education: Transversus abdominus training with multimodal cues for improved motor control and breath coordination Supine UE ball press10x Side lying UE ball press 10x bil Supine hip adduction ball press 10x Exercises: Bridge with hip adduction 2 x 10 Clam shell 2 x 10 bil Lower trunk rotation 2 x 10   09/20/22 Neuromuscular re-education: Transversus abdominus training with multimodal cues for improved motor control and breath coordination Supine UE ball press10x Side lying UE ball press 10x bil Supine hip adduction ball press 10x Exercises: Lower trunk rotation 2 x 10 Bridge with hip adduction 2 x 10 Single knee to chest 5x bil    09/11/22 Manual: Bowel mobilization Exercises: Lower trunk rotation 3 x 10 Supine piriformis stretch 2 min bil Bridge with hip adduction 12x Open books 10x bil Therapeutic activities: Fiber supplement - regular schedule Self-bowel massage   PATIENT EDUCATION:  Education details: See above Person educated: Patient Education method: Explanation, Demonstration, Tactile cues, Verbal cues, and Handouts Education comprehension: verbalized understanding  HOME EXERCISE PROGRAM: [id number]  ASSESSMENT:  CLINICAL IMPRESSION: Pt had more difficulty over this last week, but stopped taking fiber and was unable to eat normally while on vacation. We discussed return to consistent fiber while drinking plenty of water. She had difficulty with coordination of core facilitation  exercises and we spent much of this session using various multimodal cues to help improve pressure management and core activation with exercise. She felt more confident at end of session. She will benefit from skilled PT intervention in order to decrease fecal/urinary incontinence, improve nocturia, strengthen pelvic floor, and improve quality of life.   OBJECTIVE IMPAIRMENTS: decreased activity tolerance, decreased coordination, decreased endurance, decreased strength, increased fascial restrictions, increased muscle spasms, impaired tone, postural dysfunction, and pain.   ACTIVITY LIMITATIONS: continence  PARTICIPATION LIMITATIONS: community activity  PERSONAL FACTORS: 1 comorbidity: medical history are also affecting patient's functional outcome.   REHAB POTENTIAL: Good  CLINICAL DECISION MAKING: Stable/uncomplicated  EVALUATION COMPLEXITY: Low   GOALS: Goals reviewed with patient? Yes  SHORT TERM GOALS: Target date: 10/16/22  Pt will be independent with HEP.   Baseline: Goal status: INITIAL  2.  Pt will be independent with the knack, urge suppression technique, and double voiding in order to improve bladder habits and decrease urinary incontinence.   Baseline:  Goal status: INITIAL  3.  Pt will be able to correctly perform diaphragmatic breathing and appropriate pressure management in order to prevent worsening vaginal wall laxity and improve pelvic floor A/ROM.   Baseline:  Goal status: INITIAL  4.  Pt will be independent with diaphragmatic breathing and down training activities in order to improve pelvic floor relaxation.  Baseline:  Goal status: INITIAL   LONG TERM GOALS: Target date: 02/19/23  Pt will be independent with advanced HEP.   Baseline:  Goal status: INITIAL  2.  Pt will decrease frequency of nightly trips to the bathroom to 1 or less in order to get restful sleep.   Baseline:  Goal status: INITIAL  3.  Pt will report no episodes of urinary or  fecal incontinence in order to improve confidence in community activities and personal hygiene.   Baseline:  Goal status: INITIAL  4.  Pt will report no leaks with laughing, coughing, sneezing in order to improve comfort with interpersonal relationships and community activities.   Baseline:  Goal status: INITIAL  5.  Pt will demonstrate normal pelvic floor muscle tone and A/ROM, able to achieve 3/5 strength with contractions and  10 sec endurance, in order to provide appropriate lumbopelvic support in functional activities.   Baseline:  Goal status: INITIAL  6.  Pt will have 1-2 daily bowel movements without straining. Baseline:  Goal status: INITIAL  PLAN:  PT FREQUENCY: 1-2x/week  PT DURATION: 6 months  PLANNED INTERVENTIONS: Therapeutic exercises, Therapeutic activity, Neuromuscular re-education, Balance training, Gait training, Patient/Family education, Self Care, Joint mobilization, Dry Needling, Biofeedback, and Manual therapy  PLAN FOR NEXT SESSION: Review fiber supplement use and core HEP; progress as tolerated; pelvic floor long holds  Julio Alm, PT, DPT05/16/244:39 PM

## 2022-09-29 ENCOUNTER — Telehealth: Payer: Self-pay | Admitting: Hematology and Oncology

## 2022-09-29 ENCOUNTER — Telehealth: Payer: Self-pay

## 2022-09-29 NOTE — Telephone Encounter (Signed)
-----   Message from Adah Perl, PA-C sent at 09/28/2022  5:21 PM EDT ----- Please let her know hemoglobin is normal and iron stores adequate. Her B12 is dropping, so I would like her to start taking B12 1000 mcg daily again and continue indefinitely. Scheduling will call her with f/u in 3 months. Thanks

## 2022-09-29 NOTE — Telephone Encounter (Signed)
Detailed message left for patient.

## 2022-09-29 NOTE — Telephone Encounter (Signed)
09/29/22 Spoke with patient and scheduled next appt

## 2022-10-03 ENCOUNTER — Encounter: Payer: Self-pay | Admitting: Hematology and Oncology

## 2022-10-03 ENCOUNTER — Ambulatory Visit: Payer: 59

## 2022-10-03 DIAGNOSIS — R351 Nocturia: Secondary | ICD-10-CM

## 2022-10-03 DIAGNOSIS — R159 Full incontinence of feces: Secondary | ICD-10-CM

## 2022-10-03 DIAGNOSIS — N393 Stress incontinence (female) (male): Secondary | ICD-10-CM

## 2022-10-03 DIAGNOSIS — R293 Abnormal posture: Secondary | ICD-10-CM

## 2022-10-03 DIAGNOSIS — M6281 Muscle weakness (generalized): Secondary | ICD-10-CM | POA: Diagnosis not present

## 2022-10-03 DIAGNOSIS — R279 Unspecified lack of coordination: Secondary | ICD-10-CM

## 2022-10-03 NOTE — Therapy (Signed)
OUTPATIENT PHYSICAL THERAPY TREATMENT NOTE   Patient Name: Jacqueline Alexander MRN: [id number] DOB:12/15/1950, 72 y.o., female Today's Date: 10/03/2022  PCP: Ailene Ravel, MD REFERRING PROVIDER: Marguerita Beards, MD  END OF SESSION:   PT End of Session - 10/03/22 0810     Visit Number 5    Date for PT Re-Evaluation 02/19/23    Authorization Type [employer]    Progress Note Due on Visit 10    PT Start Time 0808    PT Stop Time 0846    PT Time Calculation (min) 38 min    Activity Tolerance Patient tolerated treatment well    Behavior During Therapy [facility] for tasks assessed/performed              Past Medical History:  Diagnosis Date   Anxiety    Arthritis    Barrett esophagus    Chronic diarrhea    Colon polyps    Depression    Diabetes mellitus without complication (HCC)    Family history of ovarian cancer 12/11/2021   Family history of stomach cancer 12/11/2021   GERD (gastroesophageal reflux disease)    Hyperlipemia    Hypertension    Insomnia    Lumbar radiculitis    Mitral insufficiency    Restless leg    Restless leg syndrome    Sensory urge incontinence    Past Surgical History:  Procedure Laterality Date   CARPAL TUNNEL RELEASE Left    CHOLECYSTECTOMY     COLONOSCOPY N/A 11/10/2021   Procedure: COLONOSCOPY;  Surgeon: Jaynie Collins, DO;  Location: [facility] ENDOSCOPY;  Service: Gastroenterology;  Laterality: N/A;   ESOPHAGOGASTRODUODENOSCOPY N/A 11/10/2021   Procedure: ESOPHAGOGASTRODUODENOSCOPY (EGD);  Surgeon: Jaynie Collins, DO;  Location: [facility] ENDOSCOPY;  Service: Gastroenterology;  Laterality: N/A;  DM   ESOPHAGOGASTRODUODENOSCOPY (EGD) WITH PROPOFOL N/A 08/06/2019   Procedure: ESOPHAGOGASTRODUODENOSCOPY (EGD) WITH PROPOFOL;  Surgeon: Toledo, Boykin Nearing, MD;  Location: [facility] ENDOSCOPY;  Service: Gastroenterology;  Laterality: N/A;   HERNIA REPAIR     INCONTINENCE SURGERY     sling   OOPHORECTOMY     TONSILLECTOMY     VAGINAL HYSTERECTOMY      VAGINAL PROLAPSE REPAIR     Patient Active Problem List   Diagnosis Date Noted   Genetic testing 12/28/2021   Family history of ovarian cancer 12/11/2021   Family history of stomach cancer 12/11/2021   Iron deficiency anemia 10/31/2021    REFERRING DIAG: N32.81 (ICD-10-CM) - Overactive bladder R15.9 (ICD-10-CM) - Incontinence of feces, unspecified fecal incontinence type  THERAPY DIAG:  Abnormal posture  Muscle weakness (generalized)  Unspecified lack of coordination  Nocturia  Complete fecal incontinence  Urinary, incontinence, stress female  Rationale for Evaluation and Treatment Rehabilitation  PERTINENT HISTORY: Lichens sclerosis, bladder sling 2006, vaginal hysterectomy, hernia repair, cholecystectomy   PRECAUTIONS: NA  SUBJECTIVE:  SUBJECTIVE STATEMENT:  Pt states that she had a very good weekend. Bowel movements are improving and she is sleeping well through the night most nights.    PAIN:  Are you having pain? No   EVAL SUBJECTIVE STATEMENT: Pt states that bladder difficulties after she started going through menopause. She started having trouble with bowels after hysterectomy/bladder tack. She is using clobestasol, estrogen, and coconut oil.  Fluid intake: Yes: 1 cup of coffee in am, 3 16 oz bottles of water, one bottle of soda   PAIN:  Are you having pain? No   PRECAUTIONS: None  WEIGHT BEARING RESTRICTIONS: No  FALLS:  Has patient fallen in last 6 months? No  LIVING ENVIRONMENT: Lives with: lives alone Lives in: House/apartment  OCCUPATION: retired  PLOF: Independent  PATIENT GOALS: decrease fecal incontinence   PERTINENT HISTORY:  Lichens sclerosis, bladder sling 2006, vaginal hysterectomy, hernia repair, cholecystectomy  Sexual abuse: No  BOWEL  MOVEMENT: Pain with bowel movement: No Type of bowel movement:Frequency 2-4x/day and Strain Yes,  but tries not to Fully empty rectum: No Leakage: Yes: a few times a day Pads: Yes: 1 a day, most of the time just one  Fiber supplement: Yes: occasionally  URINATION: Pain with urination: No Fully empty bladder: No, has to push on bladder to empty Stream: Weak, difficulty starting Urgency: Yes: when she is around running water Frequency: 8x/day, 4x/night Leakage: Coughing, Sneezing, Laughing, and notices wetness when she wakes at night Pads: Yes: most of the time just one  INTERCOURSE: Pain with intercourse: not sexually active, pain after menopause  PREGNANCY: Vaginal deliveries 3 Tearing Yes: sutures with her first C-section deliveries 0 Currently pregnant No  PROLAPSE: None   OBJECTIVE:  09/04/22: COGNITION: Overall cognitive status: Within functional limits for tasks assessed     SENSATION: Light touch: Appears intact Proprioception: Appears intact  GAIT: Comments: WNL  POSTURE: rounded shoulders, forward head, decreased lumbar lordosis, increased thoracic kyphosis, and posterior pelvic tilt  PALPATION:   General  no abdominal tenderness; appears to be very weak with oblique dominance                External Perineal Exam significant redness; active lichens sclerosis plaques; fusion of labia minor superiorly and phimosis                             Internal Pelvic Floor dry and very tender with sensation of ripping  Patient confirms identification and approves PT to assess internal pelvic floor and treatment Yes - just vaginal assessment at this time, discussed benefit of performing rectal exam in future sessions  PELVIC MMT:   MMT eval  Vaginal 1/5, 4 second endurance, no repeat contractions; poor coordination with tendency to bear down and push with contraction and difficulty correcting - trying to perform with breathing at this time makes this worse   Internal Anal Sphincter   External Anal Sphincter   Puborectalis   Diastasis Recti 2 finger width separation with distortion in upper abdominals   (Blank rows = not tested)        TONE: low  PROLAPSE: Mild anterior vaginal wall laxity  TODAY'S TREATMENT:  DATE:  10/03/22 Neuromuscular re-education: Supine hip adduction ball press 10x UE flexion drop in supine 2 x 10 5lbs Pelvic floor long holds, 5 x 20 seconds Seated march with anterior weight hold 2 x 10 2lbs Exercises: Bridge with clam shell 2 x 10 Seated chop 10x bil red band   09/28/22 Neuromuscular re-education: Transversus abdominus training with multimodal cues for improved motor control and breath coordination Supine UE ball press10x Side lying UE ball press 10x bil Supine hip adduction ball press 10x Exercises: Bridge with hip adduction 2 x 10 Clam shell 2 x 10 bil Lower trunk rotation 2 x 10   09/20/22 Neuromuscular re-education: Transversus abdominus training with multimodal cues for improved motor control and breath coordination Supine UE ball press10x Side lying UE ball press 10x bil Supine hip adduction ball press 10x Exercises: Lower trunk rotation 2 x 10 Bridge with hip adduction 2 x 10 Single knee to chest 5x bil   PATIENT EDUCATION:  Education details: See above Person educated: Patient Education method: Programmer, multimedia, Demonstration, Tactile cues, Verbal cues, and Handouts Education comprehension: verbalized understanding  HOME EXERCISE PROGRAM: [id number]  ASSESSMENT:  CLINICAL IMPRESSION: Pt is doing very well; she has started taking fiber regularly again and reports good progress with regular bowel movements with decreased leaking. She is feeling more confident with exercises. Due to this, she was able to progress several exercises to provide greater challenge to core  and hip muscles. She automatically is coordinating breath appropriately with movement and avoiding abnormal increases in abdominal pressure. HEP not updated this session other than increasing pelvic floor long holds to 20 seconds, but we will plan to update next time. She will benefit from skilled PT intervention in order to decrease fecal/urinary incontinence, improve nocturia, strengthen pelvic floor, and improve quality of life.   OBJECTIVE IMPAIRMENTS: decreased activity tolerance, decreased coordination, decreased endurance, decreased strength, increased fascial restrictions, increased muscle spasms, impaired tone, postural dysfunction, and pain.   ACTIVITY LIMITATIONS: continence  PARTICIPATION LIMITATIONS: community activity  PERSONAL FACTORS: 1 comorbidity: medical history are also affecting patient's functional outcome.   REHAB POTENTIAL: Good  CLINICAL DECISION MAKING: Stable/uncomplicated  EVALUATION COMPLEXITY: Low   GOALS: Goals reviewed with patient? Yes  SHORT TERM GOALS: Target date: 10/16/22  Pt will be independent with HEP.   Baseline: Goal status: INITIAL  2.  Pt will be independent with the knack, urge suppression technique, and double voiding in order to improve bladder habits and decrease urinary incontinence.   Baseline:  Goal status: INITIAL  3.  Pt will be able to correctly perform diaphragmatic breathing and appropriate pressure management in order to prevent worsening vaginal wall laxity and improve pelvic floor A/ROM.   Baseline:  Goal status: INITIAL  4.  Pt will be independent with diaphragmatic breathing and down training activities in order to improve pelvic floor relaxation.  Baseline:  Goal status: INITIAL   LONG TERM GOALS: Target date: 02/19/23  Pt will be independent with advanced HEP.   Baseline:  Goal status: INITIAL  2.  Pt will decrease frequency of nightly trips to the bathroom to 1 or less in order to get restful sleep.    Baseline:  Goal status: INITIAL  3.  Pt will report no episodes of urinary or fecal incontinence in order to improve confidence in community activities and personal hygiene.   Baseline:  Goal status: INITIAL  4.  Pt will report no leaks with laughing, coughing, sneezing in order to improve comfort with interpersonal relationships  and community activities.   Baseline:  Goal status: INITIAL  5.  Pt will demonstrate normal pelvic floor muscle tone and A/ROM, able to achieve 3/5 strength with contractions and 10 sec endurance, in order to provide appropriate lumbopelvic support in functional activities.   Baseline:  Goal status: INITIAL  6.  Pt will have 1-2 daily bowel movements without straining. Baseline:  Goal status: INITIAL  PLAN:  PT FREQUENCY: 1-2x/week  PT DURATION: 6 months  PLANNED INTERVENTIONS: Therapeutic exercises, Therapeutic activity, Neuromuscular re-education, Balance training, Gait training, Patient/Family education, Self Care, Joint mobilization, Dry Needling, Biofeedback, and Manual therapy  PLAN FOR NEXT SESSION: Update HEP; progress core strengthening (4 more appointments) - address goals   Julio Alm, PT, DPT05/21/248:46 AM

## 2022-10-05 DIAGNOSIS — K317 Polyp of stomach and duodenum: Secondary | ICD-10-CM | POA: Diagnosis not present

## 2022-10-05 DIAGNOSIS — E119 Type 2 diabetes mellitus without complications: Secondary | ICD-10-CM | POA: Diagnosis not present

## 2022-10-05 DIAGNOSIS — K219 Gastro-esophageal reflux disease without esophagitis: Secondary | ICD-10-CM | POA: Diagnosis not present

## 2022-10-05 DIAGNOSIS — K2289 Other specified disease of esophagus: Secondary | ICD-10-CM | POA: Diagnosis not present

## 2022-10-05 DIAGNOSIS — Z7984 Long term (current) use of oral hypoglycemic drugs: Secondary | ICD-10-CM | POA: Diagnosis not present

## 2022-10-05 DIAGNOSIS — K22711 Barrett's esophagus with high grade dysplasia: Secondary | ICD-10-CM | POA: Diagnosis not present

## 2022-10-05 DIAGNOSIS — I5020 Unspecified systolic (congestive) heart failure: Secondary | ICD-10-CM | POA: Diagnosis not present

## 2022-10-05 DIAGNOSIS — Z8616 Personal history of COVID-19: Secondary | ICD-10-CM | POA: Diagnosis not present

## 2022-10-05 DIAGNOSIS — E785 Hyperlipidemia, unspecified: Secondary | ICD-10-CM | POA: Diagnosis not present

## 2022-10-05 DIAGNOSIS — Z87891 Personal history of nicotine dependence: Secondary | ICD-10-CM | POA: Diagnosis not present

## 2022-10-05 DIAGNOSIS — Z79899 Other long term (current) drug therapy: Secondary | ICD-10-CM | POA: Diagnosis not present

## 2022-10-05 DIAGNOSIS — I110 Hypertensive heart disease with heart failure: Secondary | ICD-10-CM | POA: Diagnosis not present

## 2022-10-05 DIAGNOSIS — K22710 Barrett's esophagus with low grade dysplasia: Secondary | ICD-10-CM | POA: Diagnosis not present

## 2022-10-10 ENCOUNTER — Ambulatory Visit: Payer: 59

## 2022-10-13 DIAGNOSIS — M85851 Other specified disorders of bone density and structure, right thigh: Secondary | ICD-10-CM | POA: Diagnosis not present

## 2022-10-13 DIAGNOSIS — I5189 Other ill-defined heart diseases: Secondary | ICD-10-CM | POA: Diagnosis not present

## 2022-10-13 DIAGNOSIS — D509 Iron deficiency anemia, unspecified: Secondary | ICD-10-CM | POA: Diagnosis not present

## 2022-10-13 DIAGNOSIS — E538 Deficiency of other specified B group vitamins: Secondary | ICD-10-CM | POA: Diagnosis not present

## 2022-10-13 DIAGNOSIS — E1169 Type 2 diabetes mellitus with other specified complication: Secondary | ICD-10-CM | POA: Diagnosis not present

## 2022-10-13 DIAGNOSIS — I5022 Chronic systolic (congestive) heart failure: Secondary | ICD-10-CM | POA: Diagnosis not present

## 2022-10-13 DIAGNOSIS — I10 Essential (primary) hypertension: Secondary | ICD-10-CM | POA: Diagnosis not present

## 2022-10-13 DIAGNOSIS — E7800 Pure hypercholesterolemia, unspecified: Secondary | ICD-10-CM | POA: Diagnosis not present

## 2022-10-18 ENCOUNTER — Ambulatory Visit: Payer: 59 | Attending: Obstetrics and Gynecology

## 2022-10-18 DIAGNOSIS — R293 Abnormal posture: Secondary | ICD-10-CM | POA: Insufficient documentation

## 2022-10-18 DIAGNOSIS — N393 Stress incontinence (female) (male): Secondary | ICD-10-CM | POA: Diagnosis present

## 2022-10-18 DIAGNOSIS — R159 Full incontinence of feces: Secondary | ICD-10-CM | POA: Diagnosis present

## 2022-10-18 DIAGNOSIS — R351 Nocturia: Secondary | ICD-10-CM | POA: Insufficient documentation

## 2022-10-18 DIAGNOSIS — R279 Unspecified lack of coordination: Secondary | ICD-10-CM | POA: Diagnosis not present

## 2022-10-18 DIAGNOSIS — M6281 Muscle weakness (generalized): Secondary | ICD-10-CM | POA: Insufficient documentation

## 2022-10-18 NOTE — Therapy (Signed)
OUTPATIENT PHYSICAL THERAPY TREATMENT NOTE   Patient Name: Jacqueline Alexander MRN: [id number] DOB:December 11, 1950, 72 y.o., female Today's Date: 10/18/2022  PCP: Ailene Ravel, MD REFERRING PROVIDER: Marguerita Beards, MD  END OF SESSION:   PT End of Session - 10/18/22 0801     Visit Number 6    Date for PT Re-Evaluation 02/19/23    Authorization Type [employer]    PT Start Time 0800    PT Stop Time 0840    PT Time Calculation (min) 40 min    Activity Tolerance Patient tolerated treatment well    Behavior During Therapy [facility] for tasks assessed/performed               Past Medical History:  Diagnosis Date   Anxiety    Arthritis    Barrett esophagus    Chronic diarrhea    Colon polyps    Depression    Diabetes mellitus without complication (HCC)    Family history of ovarian cancer 12/11/2021   Family history of stomach cancer 12/11/2021   GERD (gastroesophageal reflux disease)    Hyperlipemia    Hypertension    Insomnia    Lumbar radiculitis    Mitral insufficiency    Restless leg    Restless leg syndrome    Sensory urge incontinence    Past Surgical History:  Procedure Laterality Date   CARPAL TUNNEL RELEASE Left    CHOLECYSTECTOMY     COLONOSCOPY N/A 11/10/2021   Procedure: COLONOSCOPY;  Surgeon: Jaynie Collins, DO;  Location: [facility] ENDOSCOPY;  Service: Gastroenterology;  Laterality: N/A;   ESOPHAGOGASTRODUODENOSCOPY N/A 11/10/2021   Procedure: ESOPHAGOGASTRODUODENOSCOPY (EGD);  Surgeon: Jaynie Collins, DO;  Location: [facility] ENDOSCOPY;  Service: Gastroenterology;  Laterality: N/A;  DM   ESOPHAGOGASTRODUODENOSCOPY (EGD) WITH PROPOFOL N/A 08/06/2019   Procedure: ESOPHAGOGASTRODUODENOSCOPY (EGD) WITH PROPOFOL;  Surgeon: Toledo, Boykin Nearing, MD;  Location: [facility] ENDOSCOPY;  Service: Gastroenterology;  Laterality: N/A;   HERNIA REPAIR     INCONTINENCE SURGERY     sling   OOPHORECTOMY     TONSILLECTOMY     VAGINAL HYSTERECTOMY     VAGINAL PROLAPSE REPAIR      Patient Active Problem List   Diagnosis Date Noted   Genetic testing 12/28/2021   Family history of ovarian cancer 12/11/2021   Family history of stomach cancer 12/11/2021   Iron deficiency anemia 10/31/2021    REFERRING DIAG: N32.81 (ICD-10-CM) - Overactive bladder R15.9 (ICD-10-CM) - Incontinence of feces, unspecified fecal incontinence type  THERAPY DIAG:  Abnormal posture  Muscle weakness (generalized)  Unspecified lack of coordination  Nocturia  Complete fecal incontinence  Urinary, incontinence, stress female  Rationale for Evaluation and Treatment Rehabilitation  PERTINENT HISTORY: Lichens sclerosis, bladder sling 2006, vaginal hysterectomy, hernia repair, cholecystectomy   PRECAUTIONS: NA  SUBJECTIVE:  SUBJECTIVE STATEMENT: Pt states that she had minor surgery on esophagus and feel like she has had a difficult time recovering with a lot of abdominal surgery. She is waiting to hear results of biopsy. Due to this, she has not been able to do a lot of exercises and is just getting her appetite back. She just started taking fiber again yesterday.    PAIN:  Are you having pain? No   EVAL SUBJECTIVE STATEMENT: Pt states that bladder difficulties after she started going through menopause. She started having trouble with bowels after hysterectomy/bladder tack. She is using clobestasol, estrogen, and coconut oil.  Fluid intake: Yes: 1 cup of coffee in am, 3 16 oz bottles of water, one bottle of soda   PAIN:  Are you having pain? No   PRECAUTIONS: None  WEIGHT BEARING RESTRICTIONS: No  FALLS:  Has patient fallen in last 6 months? No  LIVING ENVIRONMENT: Lives with: lives alone Lives in: House/apartment  OCCUPATION: retired  PLOF: Independent  PATIENT GOALS: decrease fecal  incontinence   PERTINENT HISTORY:  Lichens sclerosis, bladder sling 2006, vaginal hysterectomy, hernia repair, cholecystectomy  Sexual abuse: No  BOWEL MOVEMENT: Pain with bowel movement: No Type of bowel movement:Frequency 2-4x/day and Strain Yes,  but tries not to Fully empty rectum: No Leakage: Yes: a few times a day Pads: Yes: 1 a day, most of the time just one  Fiber supplement: Yes: occasionally  URINATION: Pain with urination: No Fully empty bladder: No, has to push on bladder to empty Stream: Weak, difficulty starting Urgency: Yes: when she is around running water Frequency: 8x/day, 4x/night Leakage: Coughing, Sneezing, Laughing, and notices wetness when she wakes at night Pads: Yes: most of the time just one  INTERCOURSE: Pain with intercourse: not sexually active, pain after menopause  PREGNANCY: Vaginal deliveries 3 Tearing Yes: sutures with her first C-section deliveries 0 Currently pregnant No  PROLAPSE: None   OBJECTIVE:  09/04/22: COGNITION: Overall cognitive status: Within functional limits for tasks assessed     SENSATION: Light touch: Appears intact Proprioception: Appears intact  GAIT: Comments: WNL  POSTURE: rounded shoulders, forward head, decreased lumbar lordosis, increased thoracic kyphosis, and posterior pelvic tilt  PALPATION:   General  no abdominal tenderness; appears to be very weak with oblique dominance                External Perineal Exam significant redness; active lichens sclerosis plaques; fusion of labia minor superiorly and phimosis                             Internal Pelvic Floor dry and very tender with sensation of ripping  Patient confirms identification and approves PT to assess internal pelvic floor and treatment Yes - just vaginal assessment at this time, discussed benefit of performing rectal exam in future sessions  PELVIC MMT:   MMT eval  Vaginal 1/5, 4 second endurance, no repeat contractions; poor  coordination with tendency to bear down and push with contraction and difficulty correcting - trying to perform with breathing at this time makes this worse  Internal Anal Sphincter   External Anal Sphincter   Puborectalis   Diastasis Recti 2 finger width separation with distortion in upper abdominals   (Blank rows = not tested)        TONE: low  PROLAPSE: Mild anterior vaginal wall laxity  TODAY'S TREATMENT:  DATE:  10/18/22 Manual: Abdominal mobilization Rib cage mobility Lt Neuromuscular re-education: Bil UE ball press 10x Bridge with hip adduction 10x Bridge with clam shell 10x Sidelying UE ball press 10x bil Exercises: Single knee to chest 5x bil Lower trunk rotation 3 x 10 Clam shell 10x bil Open books 6x bil   10/03/22 Neuromuscular re-education: Supine hip adduction ball press 10x UE flexion drop in supine 2 x 10 5lbs Pelvic floor long holds, 5 x 20 seconds Seated march with anterior weight hold 2 x 10 2lbs Exercises: Bridge with clam shell 2 x 10 Seated chop 10x bil red band   09/28/22 Neuromuscular re-education: Transversus abdominus training with multimodal cues for improved motor control and breath coordination Supine UE ball press10x Side lying UE ball press 10x bil Supine hip adduction ball press 10x Exercises: Bridge with hip adduction 2 x 10 Clam shell 2 x 10 bil Lower trunk rotation 2 x 10   PATIENT EDUCATION:  Education details: See above Person educated: Patient Education method: Explanation, Demonstration, Tactile cues, Verbal cues, and Handouts Education comprehension: verbalized understanding  HOME EXERCISE PROGRAM: [id number]  ASSESSMENT:  CLINICAL IMPRESSION: Pt is still a little sore after esophageal procedure last week. We performed manual techniques to abdomen and Lt rib cage to help keep things moving as  she returns to eating and taking fiber supplement; Lt rib cage stuck in expansion, but demonstrated some improvement with mobilization - pt reported tenderness with these techniques, but able to tolerate. She did well with mobility activities and return to previous strengthening progressions. She will benefit from skilled PT intervention in order to decrease fecal/urinary incontinence, improve nocturia, strengthen pelvic floor, and improve quality of life.   OBJECTIVE IMPAIRMENTS: decreased activity tolerance, decreased coordination, decreased endurance, decreased strength, increased fascial restrictions, increased muscle spasms, impaired tone, postural dysfunction, and pain.   ACTIVITY LIMITATIONS: continence  PARTICIPATION LIMITATIONS: community activity  PERSONAL FACTORS: 1 comorbidity: medical history are also affecting patient's functional outcome.   REHAB POTENTIAL: Good  CLINICAL DECISION MAKING: Stable/uncomplicated  EVALUATION COMPLEXITY: Low   GOALS: Goals reviewed with patient? Yes  SHORT TERM GOALS: Target date: 10/16/22 - updated 10/18/22  Pt will be independent with HEP.   Baseline: Goal status: MET 10/18/22  2.  Pt will be independent with the knack, urge suppression technique, and double voiding in order to improve bladder habits and decrease urinary incontinence.   Baseline:  Goal status: MET 10/18/22  3.  Pt will be able to correctly perform diaphragmatic breathing and appropriate pressure management in order to prevent worsening vaginal wall laxity and improve pelvic floor A/ROM.   Baseline:  Goal status: MET 10/18/22  4.  Pt will be independent with diaphragmatic breathing and down training activities in order to improve pelvic floor relaxation.  Baseline:  Goal status: MET 10/18/22   LONG TERM GOALS: Target date: 02/19/23  Pt will be independent with advanced HEP.   Baseline:  Goal status: IN PROGRESS  2.  Pt will decrease frequency of nightly trips to the  bathroom to 1 or less in order to get restful sleep.   Baseline:  Goal status: IN PROGRESS  3.  Pt will report no episodes of urinary or fecal incontinence in order to improve confidence in community activities and personal hygiene.   Baseline:  Goal status: IN PROGRESS  4.  Pt will report no leaks with laughing, coughing, sneezing in order to improve comfort with interpersonal relationships and community activities.   Baseline:  Goal  status: IN PROGRESS  5.  Pt will demonstrate normal pelvic floor muscle tone and A/ROM, able to achieve 3/5 strength with contractions and 10 sec endurance, in order to provide appropriate lumbopelvic support in functional activities.   Baseline:  Goal status: IN PROGRESS  6.  Pt will have 1-2 daily bowel movements without straining. Baseline:  Goal status: IN PROGRESS  PLAN:  PT FREQUENCY: 1-2x/week  PT DURATION: 6 months  PLANNED INTERVENTIONS: Therapeutic exercises, Therapeutic activity, Neuromuscular re-education, Balance training, Gait training, Patient/Family education, Self Care, Joint mobilization, Dry Needling, Biofeedback, and Manual therapy  PLAN FOR NEXT SESSION: Update HEP since not performed last session due to review of previous progressions; progress as tolerated.   Julio Alm, PT, DPT06/05/248:40 AM

## 2022-10-19 ENCOUNTER — Encounter: Payer: Self-pay | Admitting: Obstetrics and Gynecology

## 2022-10-19 ENCOUNTER — Ambulatory Visit: Payer: 59 | Admitting: Obstetrics and Gynecology

## 2022-10-19 VITALS — BP 100/62 | HR 89

## 2022-10-19 DIAGNOSIS — N3281 Overactive bladder: Secondary | ICD-10-CM

## 2022-10-19 DIAGNOSIS — L900 Lichen sclerosus et atrophicus: Secondary | ICD-10-CM | POA: Diagnosis not present

## 2022-10-19 DIAGNOSIS — N952 Postmenopausal atrophic vaginitis: Secondary | ICD-10-CM

## 2022-10-19 DIAGNOSIS — R350 Frequency of micturition: Secondary | ICD-10-CM | POA: Diagnosis not present

## 2022-10-19 DIAGNOSIS — R159 Full incontinence of feces: Secondary | ICD-10-CM

## 2022-10-19 NOTE — Patient Instructions (Signed)
Continue your estrogen cream x2 weekly and your clobetasol cream x3 weekly to keep the tissue from fusing together.   Continue with PT.   Let us know if you have UTI symptoms or need an appointment.

## 2022-10-19 NOTE — Progress Notes (Signed)
[facility] Return Visit  SUBJECTIVE  History of Present Illness: Jacqueline Alexander is a 72 y.o. female seen in follow-up for Lichens sclerosus, vaginal atrophy, OAB and FI. Plan at last visit was start clobetasol cream, vaginal estrogen, and pelvic floor PT.   Reports she is doing well with PT and feels like things are a little better. She reports she does not wish to pursue a medication at this time.   Has been using estrogen cream x2 weekly and Clobetasol x3 weekly.   Tried to use metamucil but feels her dietary changes have been mostly effective in controlling her FI.    Past Medical History: Patient  has a past medical history of Anxiety, Arthritis, Barrett esophagus, Chronic diarrhea, Colon polyps, Depression, Diabetes mellitus without complication (HCC), Family history of ovarian cancer (12/11/2021), Family history of stomach cancer (12/11/2021), GERD (gastroesophageal reflux disease), Hyperlipemia, Hypertension, Insomnia, Lumbar radiculitis, Mitral insufficiency, Restless leg, Restless leg syndrome, and Sensory urge incontinence.   Past Surgical History: She  has a past surgical history that includes Oophorectomy; Cholecystectomy; Hernia repair; Carpal tunnel release (Left); Esophagogastroduodenoscopy (egd) with propofol (N/A, 08/06/2019); Tonsillectomy; Vaginal hysterectomy; Incontinence surgery; Esophagogastroduodenoscopy (N/A, 11/10/2021); Colonoscopy (N/A, 11/10/2021); and Vaginal prolapse repair.   Medications: She has a current medication list which includes the following prescription(s): albuterol, aspirin ec, biotin, candesartan, carvedilol, cholecalciferol, clobetasol prop emollient base, procysbi, empagliflozin, estradiol, fluoxetine, loratadine, lorazepam, metformin, multiple vitamins-minerals, onetouch verio, pantoprazole, probiotic product, rosuvastatin, spironolactone, and vitamin b-12.   Allergies: Patient is allergic to morphine, ace inhibitors, and ezetimibe.    Social History: Patient  reports that she quit smoking about 12 years ago. Her smoking use included cigarettes. She has a 40.00 pack-year smoking history. She has never used smokeless tobacco. She reports current alcohol use. She reports that she does not currently use drugs.      OBJECTIVE     Physical Exam: Vitals:   10/19/22 0921  BP: 100/62  Pulse: 89   Gen: No apparent distress, A&O x 3.  Detailed Urogynecologic Evaluation:  Deferred.    ASSESSMENT AND PLAN    [name] is a 72 y.o. with:  1. Lichen sclerosus   2. Vaginal atrophy   3. Overactive bladder   4. Urinary frequency   5. Incontinence of feces, unspecified fecal incontinence type    Patient to continue on the Clobetasol cream x3 weekly to prevent further fusion of the labia.  Patient to continue on Estrogen cream x2 weekly.  Patient does not wish to pursue medication at this time and plans to continue with physical therapy.  Has decreased with PT interventions. Has decreased with her fiber introduction and dietary changes.   Patient to follow up in 1 year or sooner if needed.

## 2022-10-25 ENCOUNTER — Ambulatory Visit: Payer: 59

## 2022-10-25 DIAGNOSIS — M6281 Muscle weakness (generalized): Secondary | ICD-10-CM

## 2022-10-25 DIAGNOSIS — R293 Abnormal posture: Secondary | ICD-10-CM

## 2022-10-25 DIAGNOSIS — R159 Full incontinence of feces: Secondary | ICD-10-CM

## 2022-10-25 DIAGNOSIS — N393 Stress incontinence (female) (male): Secondary | ICD-10-CM

## 2022-10-25 DIAGNOSIS — R279 Unspecified lack of coordination: Secondary | ICD-10-CM | POA: Diagnosis not present

## 2022-10-25 DIAGNOSIS — R351 Nocturia: Secondary | ICD-10-CM

## 2022-10-25 NOTE — Therapy (Signed)
OUTPATIENT PHYSICAL THERAPY TREATMENT NOTE   Patient Name: Jacqueline Alexander MRN: [id number] DOB:Apr 24, 1951, 72 y.o., female Today's Date: 10/25/2022  PCP: Ailene Ravel, MD REFERRING PROVIDER: Marguerita Beards, MD  END OF SESSION:   PT End of Session - 10/25/22 1147     Visit Number 7    Date for PT Re-Evaluation 02/19/23    Authorization Type [employer]    Progress Note Due on Visit 10    PT Start Time 1145    PT Stop Time 1225    PT Time Calculation (min) 40 min    Activity Tolerance Patient tolerated treatment well    Behavior During Therapy [facility] for tasks assessed/performed               Past Medical History:  Diagnosis Date   Anxiety    Arthritis    Barrett esophagus    Chronic diarrhea    Colon polyps    Depression    Diabetes mellitus without complication (HCC)    Family history of ovarian cancer 12/11/2021   Family history of stomach cancer 12/11/2021   GERD (gastroesophageal reflux disease)    Hyperlipemia    Hypertension    Insomnia    Lumbar radiculitis    Mitral insufficiency    Restless leg    Restless leg syndrome    Sensory urge incontinence    Past Surgical History:  Procedure Laterality Date   CARPAL TUNNEL RELEASE Left    CHOLECYSTECTOMY     COLONOSCOPY N/A 11/10/2021   Procedure: COLONOSCOPY;  Surgeon: Jaynie Collins, DO;  Location: [facility] ENDOSCOPY;  Service: Gastroenterology;  Laterality: N/A;   ESOPHAGOGASTRODUODENOSCOPY N/A 11/10/2021   Procedure: ESOPHAGOGASTRODUODENOSCOPY (EGD);  Surgeon: Jaynie Collins, DO;  Location: [facility] ENDOSCOPY;  Service: Gastroenterology;  Laterality: N/A;  DM   ESOPHAGOGASTRODUODENOSCOPY (EGD) WITH PROPOFOL N/A 08/06/2019   Procedure: ESOPHAGOGASTRODUODENOSCOPY (EGD) WITH PROPOFOL;  Surgeon: Toledo, Boykin Nearing, MD;  Location: [facility] ENDOSCOPY;  Service: Gastroenterology;  Laterality: N/A;   HERNIA REPAIR     INCONTINENCE SURGERY     sling   OOPHORECTOMY     TONSILLECTOMY     VAGINAL HYSTERECTOMY      VAGINAL PROLAPSE REPAIR     Patient Active Problem List   Diagnosis Date Noted   Genetic testing 12/28/2021   Family history of ovarian cancer 12/11/2021   Family history of stomach cancer 12/11/2021   Iron deficiency anemia 10/31/2021    REFERRING DIAG: N32.81 (ICD-10-CM) - Overactive bladder R15.9 (ICD-10-CM) - Incontinence of feces, unspecified fecal incontinence type  THERAPY DIAG:  Abnormal posture  Muscle weakness (generalized)  Unspecified lack of coordination  Nocturia  Complete fecal incontinence  Urinary, incontinence, stress female  Rationale for Evaluation and Treatment Rehabilitation  PERTINENT HISTORY: Lichens sclerosis, bladder sling 2006, vaginal hysterectomy, hernia repair, cholecystectomy   PRECAUTIONS: NA  SUBJECTIVE:  SUBJECTIVE STATEMENT: Pt states that she is seeing progress again. She still has some days when she had more difficulty with small bowel movements, but in general she is noticing that she is emptying better. She does not have any issues with bladder as long as she is drinking enough water.    PAIN:  Are you having pain? No   EVAL SUBJECTIVE STATEMENT: Pt states that bladder difficulties after she started going through menopause. She started having trouble with bowels after hysterectomy/bladder tack. She is using clobestasol, estrogen, and coconut oil.  Fluid intake: Yes: 1 cup of coffee in am, 3 16 oz bottles of water, one bottle of soda   PAIN:  Are you having pain? No   PRECAUTIONS: None  WEIGHT BEARING RESTRICTIONS: No  FALLS:  Has patient fallen in last 6 months? No  LIVING ENVIRONMENT: Lives with: lives alone Lives in: House/apartment  OCCUPATION: retired  PLOF: Independent  PATIENT GOALS: decrease fecal incontinence   PERTINENT  HISTORY:  Lichens sclerosis, bladder sling 2006, vaginal hysterectomy, hernia repair, cholecystectomy  Sexual abuse: No  BOWEL MOVEMENT: Pain with bowel movement: No Type of bowel movement:Frequency 2-4x/day and Strain Yes,  but tries not to Fully empty rectum: No Leakage: Yes: a few times a day Pads: Yes: 1 a day, most of the time just one  Fiber supplement: Yes: occasionally  URINATION: Pain with urination: No Fully empty bladder: No, has to push on bladder to empty Stream: Weak, difficulty starting Urgency: Yes: when she is around running water Frequency: 8x/day, 4x/night Leakage: Coughing, Sneezing, Laughing, and notices wetness when she wakes at night Pads: Yes: most of the time just one  INTERCOURSE: Pain with intercourse: not sexually active, pain after menopause  PREGNANCY: Vaginal deliveries 3 Tearing Yes: sutures with her first C-section deliveries 0 Currently pregnant No  PROLAPSE: None   OBJECTIVE:  09/04/22: COGNITION: Overall cognitive status: Within functional limits for tasks assessed     SENSATION: Light touch: Appears intact Proprioception: Appears intact  GAIT: Comments: WNL  POSTURE: rounded shoulders, forward head, decreased lumbar lordosis, increased thoracic kyphosis, and posterior pelvic tilt  PALPATION:   General  no abdominal tenderness; appears to be very weak with oblique dominance                External Perineal Exam significant redness; active lichens sclerosis plaques; fusion of labia minor superiorly and phimosis                             Internal Pelvic Floor dry and very tender with sensation of ripping  Patient confirms identification and approves PT to assess internal pelvic floor and treatment Yes - just vaginal assessment at this time, discussed benefit of performing rectal exam in future sessions  PELVIC MMT:   MMT eval  Vaginal 1/5, 4 second endurance, no repeat contractions; poor coordination with tendency to bear  down and push with contraction and difficulty correcting - trying to perform with breathing at this time makes this worse  Internal Anal Sphincter   External Anal Sphincter   Puborectalis   Diastasis Recti 2 finger width separation with distortion in upper abdominals   (Blank rows = not tested)        TONE: low  PROLAPSE: Mild anterior vaginal wall laxity  TODAY'S TREATMENT:  DATE:  10/25/22 Neuromuscular re-education: Seated march with anterior hold 2 x 20 2lbs  Seated chop 2 x 10 bil 3lbs Seated weighted twist 2 x 10 5lbs  Exercises:  Therapeutic activities: Squat 2 x 10 to table Standing rows 2 x 10 green band Standing extensions 2 x 10 green band   10/18/22 Manual: Abdominal mobilization Rib cage mobility Lt Neuromuscular re-education: Bil UE ball press 10x Bridge with hip adduction 10x Bridge with clam shell 10x Sidelying UE ball press 10x bil Exercises: Single knee to chest 5x bil Lower trunk rotation 3 x 10 Clam shell 10x bil Open books 6x bil   10/03/22 Neuromuscular re-education: Supine hip adduction ball press 10x UE flexion drop in supine 2 x 10 5lbs Pelvic floor long holds, 5 x 20 seconds Seated march with anterior weight hold 2 x 10 2lbs Exercises: Bridge with clam shell 2 x 10 Seated chop 10x bil red band   PATIENT EDUCATION:  Education details: See above Person educated: Patient Education method: Explanation, Demonstration, Tactile cues, Verbal cues, and Handouts Education comprehension: verbalized understanding  HOME EXERCISE PROGRAM: [id number]  ASSESSMENT:  CLINICAL IMPRESSION: Pt doing better this week with abdominal comfort being further out from surgery. She continues to see excellent progress with bowel movements and bladder function. She was able to progress to more seated and standing exercises this treatment  session with good tolerance. HEP updated. We will plan for next session to be patient's last and make sure we answer any questions/concerns and update HEP for final time. She will benefit from skilled PT intervention in order to decrease fecal/urinary incontinence, improve nocturia, strengthen pelvic floor, and improve quality of life.   OBJECTIVE IMPAIRMENTS: decreased activity tolerance, decreased coordination, decreased endurance, decreased strength, increased fascial restrictions, increased muscle spasms, impaired tone, postural dysfunction, and pain.   ACTIVITY LIMITATIONS: continence  PARTICIPATION LIMITATIONS: community activity  PERSONAL FACTORS: 1 comorbidity: medical history are also affecting patient's functional outcome.   REHAB POTENTIAL: Good  CLINICAL DECISION MAKING: Stable/uncomplicated  EVALUATION COMPLEXITY: Low   GOALS: Goals reviewed with patient? Yes  SHORT TERM GOALS: Target date: 10/16/22 - updated 10/18/22  Pt will be independent with HEP.   Baseline: Goal status: MET 10/18/22  2.  Pt will be independent with the knack, urge suppression technique, and double voiding in order to improve bladder habits and decrease urinary incontinence.   Baseline:  Goal status: MET 10/18/22  3.  Pt will be able to correctly perform diaphragmatic breathing and appropriate pressure management in order to prevent worsening vaginal wall laxity and improve pelvic floor A/ROM.   Baseline:  Goal status: MET 10/18/22  4.  Pt will be independent with diaphragmatic breathing and down training activities in order to improve pelvic floor relaxation.  Baseline:  Goal status: MET 10/18/22   LONG TERM GOALS: Target date: 02/19/23  Pt will be independent with advanced HEP.   Baseline:  Goal status: IN PROGRESS  2.  Pt will decrease frequency of nightly trips to the bathroom to 1 or less in order to get restful sleep.   Baseline:  Goal status: IN PROGRESS  3.  Pt will report no  episodes of urinary or fecal incontinence in order to improve confidence in community activities and personal hygiene.   Baseline:  Goal status: IN PROGRESS  4.  Pt will report no leaks with laughing, coughing, sneezing in order to improve comfort with interpersonal relationships and community activities.   Baseline:  Goal status: IN PROGRESS  5.  Pt will demonstrate normal pelvic floor muscle tone and A/ROM, able to achieve 3/5 strength with contractions and 10 sec endurance, in order to provide appropriate lumbopelvic support in functional activities.   Baseline:  Goal status: IN PROGRESS  6.  Pt will have 1-2 daily bowel movements without straining. Baseline:  Goal status: IN PROGRESS  PLAN:  PT FREQUENCY: 1-2x/week  PT DURATION: 6 months  PLANNED INTERVENTIONS: Therapeutic exercises, Therapeutic activity, Neuromuscular re-education, Balance training, Gait training, Patient/Family education, Self Care, Joint mobilization, Dry Needling, Biofeedback, and Manual therapy  PLAN FOR NEXT SESSION: D/C next session; progress exercises - 3 way kick, heel raises, side stepping  Julio Alm, PT, DPT06/04/2411:27 PM

## 2022-11-02 ENCOUNTER — Ambulatory Visit: Payer: 59

## 2022-11-02 DIAGNOSIS — R279 Unspecified lack of coordination: Secondary | ICD-10-CM

## 2022-11-02 DIAGNOSIS — N393 Stress incontinence (female) (male): Secondary | ICD-10-CM

## 2022-11-02 DIAGNOSIS — M6281 Muscle weakness (generalized): Secondary | ICD-10-CM | POA: Diagnosis not present

## 2022-11-02 DIAGNOSIS — R293 Abnormal posture: Secondary | ICD-10-CM

## 2022-11-02 DIAGNOSIS — R351 Nocturia: Secondary | ICD-10-CM

## 2022-11-02 DIAGNOSIS — R159 Full incontinence of feces: Secondary | ICD-10-CM

## 2022-11-02 NOTE — Therapy (Signed)
OUTPATIENT PHYSICAL THERAPY TREATMENT NOTE   Patient Name: Jacqueline Alexander MRN: [id number] DOB:Aug 09, 1950, 72 y.o., female Today's Date: 11/02/2022  PCP: Ailene Ravel, MD REFERRING PROVIDER: Marguerita Beards, MD  END OF SESSION:   PT End of Session - 11/02/22 1234     Visit Number 8    Date for PT Re-Evaluation 02/19/23    Authorization Type [employer]    Progress Note Due on Visit 10    PT Start Time 1230    PT Stop Time 1308    PT Time Calculation (min) 38 min    Activity Tolerance Patient tolerated treatment well    Behavior During Therapy [facility] for tasks assessed/performed               Past Medical History:  Diagnosis Date   Anxiety    Arthritis    Barrett esophagus    Chronic diarrhea    Colon polyps    Depression    Diabetes mellitus without complication (HCC)    Family history of ovarian cancer 12/11/2021   Family history of stomach cancer 12/11/2021   GERD (gastroesophageal reflux disease)    Hyperlipemia    Hypertension    Insomnia    Lumbar radiculitis    Mitral insufficiency    Restless leg    Restless leg syndrome    Sensory urge incontinence    Past Surgical History:  Procedure Laterality Date   CARPAL TUNNEL RELEASE Left    CHOLECYSTECTOMY     COLONOSCOPY N/A 11/10/2021   Procedure: COLONOSCOPY;  Surgeon: Jaynie Collins, DO;  Location: [facility] ENDOSCOPY;  Service: Gastroenterology;  Laterality: N/A;   ESOPHAGOGASTRODUODENOSCOPY N/A 11/10/2021   Procedure: ESOPHAGOGASTRODUODENOSCOPY (EGD);  Surgeon: Jaynie Collins, DO;  Location: [facility] ENDOSCOPY;  Service: Gastroenterology;  Laterality: N/A;  DM   ESOPHAGOGASTRODUODENOSCOPY (EGD) WITH PROPOFOL N/A 08/06/2019   Procedure: ESOPHAGOGASTRODUODENOSCOPY (EGD) WITH PROPOFOL;  Surgeon: Toledo, Boykin Nearing, MD;  Location: [facility] ENDOSCOPY;  Service: Gastroenterology;  Laterality: N/A;   HERNIA REPAIR     INCONTINENCE SURGERY     sling   OOPHORECTOMY     TONSILLECTOMY     VAGINAL HYSTERECTOMY      VAGINAL PROLAPSE REPAIR     Patient Active Problem List   Diagnosis Date Noted   Genetic testing 12/28/2021   Family history of ovarian cancer 12/11/2021   Family history of stomach cancer 12/11/2021   Iron deficiency anemia 10/31/2021    REFERRING DIAG: N32.81 (ICD-10-CM) - Overactive bladder R15.9 (ICD-10-CM) - Incontinence of feces, unspecified fecal incontinence type  THERAPY DIAG:  Abnormal posture  Muscle weakness (generalized)  Unspecified lack of coordination  Nocturia  Complete fecal incontinence  Urinary, incontinence, stress female  Rationale for Evaluation and Treatment Rehabilitation  PERTINENT HISTORY: Lichens sclerosis, bladder sling 2006, vaginal hysterectomy, hernia repair, cholecystectomy   PRECAUTIONS: NA  SUBJECTIVE:  SUBJECTIVE STATEMENT: Pt states that bowels are overall very good. She will still have bad days. She is still struggling to drink as much as she should during the day and ends up drinking more at night. She does feel prepared to D/C and that she has tools and exercises to keep working on.    PAIN:  Are you having pain? No   EVAL SUBJECTIVE STATEMENT: Pt states that bladder difficulties after she started going through menopause. She started having trouble with bowels after hysterectomy/bladder tack. She is using clobestasol, estrogen, and coconut oil.  Fluid intake: Yes: 1 cup of coffee in am, 3 16 oz bottles of water, one bottle of soda   PAIN:  Are you having pain? No   PRECAUTIONS: None  WEIGHT BEARING RESTRICTIONS: No  FALLS:  Has patient fallen in last 6 months? No  LIVING ENVIRONMENT: Lives with: lives alone Lives in: House/apartment  OCCUPATION: retired  PLOF: Independent  PATIENT GOALS: decrease fecal incontinence   PERTINENT  HISTORY:  Lichens sclerosis, bladder sling 2006, vaginal hysterectomy, hernia repair, cholecystectomy  Sexual abuse: No  BOWEL MOVEMENT: Pain with bowel movement: No Type of bowel movement:Frequency 2-4x/day and Strain Yes,  but tries not to Fully empty rectum: No Leakage: Yes: a few times a day Pads: Yes: 1 a day, most of the time just one  Fiber supplement: Yes: occasionally  URINATION: Pain with urination: No Fully empty bladder: No, has to push on bladder to empty Stream: Weak, difficulty starting Urgency: Yes: when she is around running water Frequency: 8x/day, 4x/night Leakage: Coughing, Sneezing, Laughing, and notices wetness when she wakes at night Pads: Yes: most of the time just one  INTERCOURSE: Pain with intercourse: not sexually active, pain after menopause  PREGNANCY: Vaginal deliveries 3 Tearing Yes: sutures with her first C-section deliveries 0 Currently pregnant No  PROLAPSE: None   OBJECTIVE:  09/04/22: COGNITION: Overall cognitive status: Within functional limits for tasks assessed     SENSATION: Light touch: Appears intact Proprioception: Appears intact  GAIT: Comments: WNL  POSTURE: rounded shoulders, forward head, decreased lumbar lordosis, increased thoracic kyphosis, and posterior pelvic tilt  PALPATION:   General  no abdominal tenderness; appears to be very weak with oblique dominance                External Perineal Exam significant redness; active lichens sclerosis plaques; fusion of labia minor superiorly and phimosis                             Internal Pelvic Floor dry and very tender with sensation of ripping  Patient confirms identification and approves PT to assess internal pelvic floor and treatment Yes - just vaginal assessment at this time, discussed benefit of performing rectal exam in future sessions  PELVIC MMT:   MMT eval  Vaginal 1/5, 4 second endurance, no repeat contractions; poor coordination with tendency to bear  down and push with contraction and difficulty correcting - trying to perform with breathing at this time makes this worse  Internal Anal Sphincter   External Anal Sphincter   Puborectalis   Diastasis Recti 2 finger width separation with distortion in upper abdominals   (Blank rows = not tested)        TONE: low  PROLAPSE: Mild anterior vaginal wall laxity  TODAY'S TREATMENT:  DATE:  11/02/22 Therapeutic activities: Reviewed timing of fluids and bladder irritants Consistent use of fiber Review of urge drill and how to use at night HEP review Squat 2 x 10 to table Standing rows 2 x 10 green band Standing extensions 2 x 10 green band   10/25/22 Neuromuscular re-education: Seated march with anterior hold 2 x 20 2lbs  Seated chop 2 x 10 bil 3lbs Seated weighted twist 2 x 10 5lbs  Exercises:  Therapeutic activities: Squat 2 x 10 to table Standing rows 2 x 10 green band Standing extensions 2 x 10 green band   10/18/22 Manual: Abdominal mobilization Rib cage mobility Lt Neuromuscular re-education: Bil UE ball press 10x Bridge with hip adduction 10x Bridge with clam shell 10x Sidelying UE ball press 10x bil Exercises: Single knee to chest 5x bil Lower trunk rotation 3 x 10 Clam shell 10x bil Open books 6x bil    PATIENT EDUCATION:  Education details: See above Person educated: Patient Education method: Programmer, multimedia, Demonstration, Tactile cues, Verbal cues, and Handouts Education comprehension: verbalized understanding  HOME EXERCISE PROGRAM: [id number]  ASSESSMENT:  CLINICAL IMPRESSION: Pt has done very well in pelvic floor PT, demonstrating significant improvement in fecal incontinence, frequency of bowel movements, and stress urinary incontinence. She has been able to progress HEP and is very compliant with regular performance at home. We  reviewed urge drill and nightly timing of fluids in detail to help improve nocturia as this was getting better and has recently gotten worse again. Believe that she will quickly be able to get these symptoms under control again with urge drill and cutting off fluids earlier. Due to progress and having met rehab goals/has the tools to continue making progress, she is prepared to D/C at this time. She was encouraged to call with any questions or concerns.   OBJECTIVE IMPAIRMENTS: decreased activity tolerance, decreased coordination, decreased endurance, decreased strength, increased fascial restrictions, increased muscle spasms, impaired tone, postural dysfunction, and pain.   ACTIVITY LIMITATIONS: continence  PARTICIPATION LIMITATIONS: community activity  PERSONAL FACTORS: 1 comorbidity: medical history are also affecting patient's functional outcome.   REHAB POTENTIAL: Good  CLINICAL DECISION MAKING: Stable/uncomplicated  EVALUATION COMPLEXITY: Low   GOALS: Goals reviewed with patient? Yes  SHORT TERM GOALS: Target date: 10/16/22 - updated 10/18/22   Pt will be independent with HEP.   Baseline: Goal status: MET 10/18/22  2.  Pt will be independent with the knack, urge suppression technique, and double voiding in order to improve bladder habits and decrease urinary incontinence.   Baseline:  Goal status: MET 10/18/22  3.  Pt will be able to correctly perform diaphragmatic breathing and appropriate pressure management in order to prevent worsening vaginal wall laxity and improve pelvic floor A/ROM.   Baseline:  Goal status: MET 10/18/22  4.  Pt will be independent with diaphragmatic breathing and down training activities in order to improve pelvic floor relaxation.  Baseline:  Goal status: MET 10/18/22   LONG TERM GOALS: Target date: 02/19/23 - updated 11/02/22  Pt will be independent with advanced HEP.   Baseline:  Goal status: MET 11/02/22  2.  Pt will decrease frequency of nightly  trips to the bathroom to 1 or less in order to get restful sleep.   Baseline: Pt was doing much better with this, but has recently started having more difficulty - we reviewed nightly timing of fluids, bladder irritants, and urge drill Goal status: DISCHARGED  3.  Pt will report no episodes of urinary  or fecal incontinence in order to improve confidence in community activities and personal hygiene.   Baseline: doing better - will occasionally have some amount of fecal incontinence  Goal status: MET 11/02/22  4.  Pt will report no leaks with laughing, coughing, sneezing in order to improve comfort with interpersonal relationships and community activities.   Baseline:  Goal status: MET 11/02/22  5.  Pt will demonstrate normal pelvic floor muscle tone and A/ROM, able to achieve 3/5 strength with contractions and 10 sec endurance, in order to provide appropriate lumbopelvic support in functional activities.   Baseline: not formally assessed this session due to progress Goal status: DISCHARGED  6.  Pt will have 1-2 daily bowel movements without straining. Baseline: 2 bowel movements a day Goal status: MET 11/02/22  PLAN:  PT FREQUENCY: -  PT DURATION: -  PLANNED INTERVENTIONS: -  PLAN FOR NEXT SESSION: D/C  PHYSICAL THERAPY DISCHARGE SUMMARY  Visits from Start of Care: 8  Current functional level related to goals / functional outcomes: Independent   Remaining deficits: See above   Education / Equipment: HEP   Patient agrees to discharge. Patient goals were met. Patient is being discharged due to meeting the stated rehab goals.   Julio Alm, PT, DPT06/20/241:50 PM

## 2022-11-21 DIAGNOSIS — I447 Left bundle-branch block, unspecified: Secondary | ICD-10-CM | POA: Diagnosis not present

## 2022-11-21 DIAGNOSIS — I5022 Chronic systolic (congestive) heart failure: Secondary | ICD-10-CM | POA: Diagnosis not present

## 2022-11-21 DIAGNOSIS — R0602 Shortness of breath: Secondary | ICD-10-CM | POA: Diagnosis not present

## 2022-12-15 DIAGNOSIS — K2270 Barrett's esophagus without dysplasia: Secondary | ICD-10-CM | POA: Diagnosis not present

## 2022-12-15 DIAGNOSIS — J4520 Mild intermittent asthma, uncomplicated: Secondary | ICD-10-CM | POA: Diagnosis not present

## 2022-12-15 DIAGNOSIS — I5020 Unspecified systolic (congestive) heart failure: Secondary | ICD-10-CM | POA: Diagnosis not present

## 2022-12-15 DIAGNOSIS — E1140 Type 2 diabetes mellitus with diabetic neuropathy, unspecified: Secondary | ICD-10-CM | POA: Diagnosis not present

## 2022-12-15 DIAGNOSIS — I447 Left bundle-branch block, unspecified: Secondary | ICD-10-CM | POA: Diagnosis not present

## 2022-12-15 DIAGNOSIS — Z8616 Personal history of COVID-19: Secondary | ICD-10-CM | POA: Diagnosis not present

## 2022-12-15 DIAGNOSIS — K22710 Barrett's esophagus with low grade dysplasia: Secondary | ICD-10-CM | POA: Diagnosis not present

## 2022-12-15 DIAGNOSIS — E785 Hyperlipidemia, unspecified: Secondary | ICD-10-CM | POA: Diagnosis not present

## 2022-12-15 DIAGNOSIS — K219 Gastro-esophageal reflux disease without esophagitis: Secondary | ICD-10-CM | POA: Diagnosis not present

## 2022-12-15 DIAGNOSIS — I501 Left ventricular failure: Secondary | ICD-10-CM | POA: Diagnosis not present

## 2022-12-15 DIAGNOSIS — Z7984 Long term (current) use of oral hypoglycemic drugs: Secondary | ICD-10-CM | POA: Diagnosis not present

## 2022-12-15 DIAGNOSIS — K22711 Barrett's esophagus with high grade dysplasia: Secondary | ICD-10-CM | POA: Diagnosis not present

## 2022-12-15 DIAGNOSIS — Z7951 Long term (current) use of inhaled steroids: Secondary | ICD-10-CM | POA: Diagnosis not present

## 2022-12-15 DIAGNOSIS — Z9049 Acquired absence of other specified parts of digestive tract: Secondary | ICD-10-CM | POA: Diagnosis not present

## 2022-12-15 DIAGNOSIS — Z87891 Personal history of nicotine dependence: Secondary | ICD-10-CM | POA: Diagnosis not present

## 2022-12-15 DIAGNOSIS — Z8719 Personal history of other diseases of the digestive system: Secondary | ICD-10-CM | POA: Diagnosis not present

## 2022-12-15 DIAGNOSIS — I110 Hypertensive heart disease with heart failure: Secondary | ICD-10-CM | POA: Diagnosis not present

## 2022-12-15 DIAGNOSIS — Z79899 Other long term (current) drug therapy: Secondary | ICD-10-CM | POA: Diagnosis not present

## 2022-12-15 DIAGNOSIS — E119 Type 2 diabetes mellitus without complications: Secondary | ICD-10-CM | POA: Diagnosis not present

## 2022-12-15 DIAGNOSIS — Z7982 Long term (current) use of aspirin: Secondary | ICD-10-CM | POA: Diagnosis not present

## 2022-12-15 DIAGNOSIS — Z9889 Other specified postprocedural states: Secondary | ICD-10-CM | POA: Diagnosis not present

## 2022-12-15 HISTORY — PX: OTHER SURGICAL HISTORY: SHX169

## 2022-12-21 ENCOUNTER — Other Ambulatory Visit: Payer: Self-pay | Admitting: Hematology and Oncology

## 2022-12-21 DIAGNOSIS — D509 Iron deficiency anemia, unspecified: Secondary | ICD-10-CM

## 2022-12-26 ENCOUNTER — Inpatient Hospital Stay: Payer: 59 | Attending: Hematology and Oncology | Admitting: Hematology and Oncology

## 2022-12-26 ENCOUNTER — Inpatient Hospital Stay: Payer: 59

## 2022-12-26 ENCOUNTER — Encounter: Payer: Self-pay | Admitting: Hematology and Oncology

## 2022-12-26 VITALS — BP 110/59 | HR 80 | Temp 98.6°F | Resp 18 | Ht 63.7 in | Wt 149.7 lb

## 2022-12-26 DIAGNOSIS — D509 Iron deficiency anemia, unspecified: Secondary | ICD-10-CM | POA: Insufficient documentation

## 2022-12-26 LAB — IRON AND TIBC
Iron: 84 ug/dL (ref 28–170)
Saturation Ratios: 22 % (ref 10.4–31.8)
TIBC: 381 ug/dL (ref 250–450)
UIBC: 297 ug/dL

## 2022-12-26 LAB — CMP (CANCER CENTER ONLY)
ALT: 14 U/L (ref 0–44)
AST: 18 U/L (ref 15–41)
Albumin: 4.1 g/dL (ref 3.5–5.0)
Alkaline Phosphatase: 62 U/L (ref 38–126)
Anion gap: 7 (ref 5–15)
BUN: 13 mg/dL (ref 8–23)
CO2: 25 mmol/L (ref 22–32)
Calcium: 9.7 mg/dL (ref 8.9–10.3)
Chloride: 105 mmol/L (ref 98–111)
Creatinine: 0.89 mg/dL (ref 0.44–1.00)
GFR, Estimated: >60 mL/min (ref >60)
Glucose, Bld: 157 mg/dL — ABNORMAL HIGH (ref 70–99)
Potassium: 4.2 mmol/L (ref 3.5–5.1)
Sodium: 137 mmol/L (ref 135–145)
Total Bilirubin: 0.4 mg/dL (ref 0.3–1.2)
Total Protein: 7.4 g/dL (ref 6.5–8.1)

## 2022-12-26 LAB — CBC WITH DIFFERENTIAL (CANCER CENTER ONLY)
Abs Immature Granulocytes: 0.04 10*3/uL (ref 0.00–0.07)
Basophils Absolute: 0.1 10*3/uL (ref 0.0–0.1)
Basophils Relative: 1 %
Eosinophils Absolute: 0.2 10*3/uL (ref 0.0–0.5)
Eosinophils Relative: 2 %
HCT: 41.3 % (ref 36.0–46.0)
Hemoglobin: 12.7 g/dL (ref 12.0–15.0)
Immature Granulocytes: 0 %
Lymphocytes Relative: 18 %
Lymphs Abs: 1.9 10*3/uL (ref 0.7–4.0)
MCH: 29 pg (ref 26.0–34.0)
MCHC: 30.8 g/dL (ref 30.0–36.0)
MCV: 94.3 fL (ref 80.0–100.0)
Monocytes Absolute: 0.8 10*3/uL (ref 0.1–1.0)
Monocytes Relative: 7 %
Neutro Abs: 7.5 10*3/uL (ref 1.7–7.7)
Neutrophils Relative %: 72 %
Platelet Count: 351 10*3/uL (ref 150–400)
RBC: 4.38 MIL/uL (ref 3.87–5.11)
RDW: 13.2 % (ref 11.5–15.5)
WBC Count: 10.5 10*3/uL (ref 4.0–10.5)
nRBC: 0 % (ref 0.0–0.2)

## 2022-12-26 LAB — FERRITIN: Ferritin: 27 ng/mL (ref 11–307)

## 2022-12-26 NOTE — Assessment & Plan Note (Addendum)
Iron deficiency anemia diagnosed in May 2023.  She did not tolerate oral iron supplementation.  She received IV iron in the form of Feraheme in June.  She underwent EGD and colonoscopy at the end of June and no source of bleeding was found.  She had removal of stomach polyps, which were benign.  She was found to have Barrett esophagus.  She also had removal of colon polyps, which were hyperplastic polyps. Repeat EGD at [facility] in January revealed more advanced appearing lesion. Endoscopic submucosal dissection was scheduled, but not done due to EKG abnormalities.  She underwent endoscopic submucosal resection in May.  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  She has continued to follow with GI and had repeat EGD on August 2 and had the Barrett esophagus treated with RFA.  She has had difficulty swallowing with some weight loss since the procedure. She is slowly advancing her diet.  Her hemoglobin remains normal and iron stores remain adequate, so we will continue to monitor her.  I will plan to see her back in 3 months for repeat clinical assessment.

## 2022-12-26 NOTE — Progress Notes (Signed)
[facility]  [address] [place],  [place]  [address] [phone]  Clinic Day:  12/26/2022  Referring physician: Ailene Ravel, MD  ASSESSMENT & PLAN:   Assessment & Plan: Iron deficiency anemia Iron deficiency anemia diagnosed in May 2023.  She did not tolerate oral iron supplementation.  She received IV iron in the form of Feraheme in June.  She underwent EGD and colonoscopy at the end of June and no source of bleeding was found.  She had removal of stomach polyps, which were benign.  She was found to have Barrett esophagus.  She also had removal of colon polyps, which were hyperplastic polyps. Repeat EGD at [facility] in January revealed more advanced appearing lesion. Endoscopic submucosal dissection was scheduled, but not done due to EKG abnormalities.  She underwent endoscopic submucosal resection in May.  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  She has continued to follow with GI and had repeat EGD on August 2 and had the Barrett esophagus treated with RFA.  She has had difficulty swallowing with some weight loss since the procedure. She is slowly advancing her diet.  Her hemoglobin remains normal and iron stores remain adequate, so we will continue to monitor her.  I will plan to see her back in 3 months for repeat clinical assessment.     The patient understands the plans discussed today and is in agreement with them.  She knows to contact our office if she develops concerns prior to her next appointment.   I provided 15 minutes of face-to-face time during this encounter and > 50% was spent counseling as documented under my assessment and plan.    Adah Perl, PA-C  [facility] AT [facility] [address] [place] [address] Dept: [phone] Dept Fax: [phone]   Orders Placed This Encounter   Procedures   CBC with Differential (Cancer Center Only)    Standing Status:   Future    Standing Expiration Date:   12/26/2023   CMP (Cancer Center only)    Standing Status:   Future    Standing Expiration Date:   12/26/2023   Ferritin    Standing Status:   Future    Standing Expiration Date:   12/26/2023   Iron and TIBC    Standing Status:   Future    Standing Expiration Date:   12/26/2023      CHIEF COMPLAINT:  CC: Iron deficiency anemia  Current Treatment:  Surveillance  HISTORY OF PRESENT ILLNESS:  Jacqueline Alexander is a 72 y.o. female with iron deficiency anemia who I began seeing in June 2023.  The patient had evidence of iron deficiency dating back to June 2022. She does not tolerate oral iron due to constipation, so was referred to [place] for IV iron.  She received IV Feraheme in June with normalization of her hemoglobin and iron stores.  She has not had recurrent anemia.  She was taking oral B12 and her B12 level was elevated in November, so we had her discontinue B12 supplement.  She has a history of Barrett's esophagus and her last EGD was in March 2021 at [facility] in [place].  This revealed persistent Barrett's disease. She continues pantoprazole 20 mg daily.  Her last colonoscopy was in January 2018 at [facility].  This was normal, so follow-up in 10 years was recommended.  The patient  has never had gastric surgery.  She underwent total hysterectomy and bilateral salpingo-oophorectomy with bladder tacking at age 89 for bladder and uterine prolapse and denies vaginal bleeding. She is on aspirin 81 mg daily.  She denied heavy alcohol use, but does drink socially.    She was referred to [facility] for follow-up of her Barrett's disease and was seen in January.  EGD revealed more advanced changes of Barrett's, so endoscopic submucosal dissection was scheduled.  This procedure was not done due to findings of a left bundle branch block preop EKG.  She saw her cardiologist and had  an echocardiogram.underwent endoscopic submucosal resection in May.  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   INTERVAL HISTORY:  Jacqueline Alexander is here today for repeat clinical assessment and denies progressive fatigue concerning for recurrent anemia.  She had repeat EGD on August with RFA of Barrett esophagus. She reports odynophagia as the procedure, but has slowly been advancing her diet.  She reports constipation, which she attributes to Carafate, so she discontinued the Carafate.  She has been able to get her bowels moving with a laxative.  She states she did have slight bright red blood per rectum with the constipation.  She denies melena.  Her appetite is good, but she has lost weight due to decreased intake since the recent procedure.  She denies fevers or chills.  Her weight has decreased 5 pounds over last 3 months .   She states she is scheduled to have a pacemaker/defibrillator placed on August 27.  REVIEW OF SYSTEMS:  Review of Systems  Constitutional:  Positive for unexpected weight change. Negative for appetite change, chills, fatigue and fever.  HENT:   Positive for trouble swallowing (Painful swallowing). Negative for lump/mass, mouth sores and sore throat.   Respiratory:  Negative for cough and shortness of breath.   Cardiovascular:  Negative for chest pain and leg swelling.  Gastrointestinal:  Positive for constipation. Negative for abdominal pain, diarrhea, nausea and vomiting.  Endocrine: Negative for hot flashes.  Genitourinary:  Negative for difficulty urinating, dysuria, frequency and hematuria.   Musculoskeletal:  Negative for arthralgias, back pain and myalgias.  Skin:  Negative for rash.  Neurological:  Negative for dizziness and headaches.  Hematological:  Negative for adenopathy. Does not bruise/bleed easily.  Psychiatric/Behavioral:  Negative for depression and sleep disturbance. The  patient is not nervous/anxious.      VITALS:  Blood pressure (!) 110/59, pulse 80, temperature 98.6 F (37 C), temperature source Oral, resp. rate 18, height 5' 3.7" (1.618 m), weight 149 lb 11.2 oz (67.9 kg), SpO2 98%.  Wt Readings from Last 3 Encounters:  12/26/22 149 lb 11.2 oz (67.9 kg)  09/28/22 155 lb 3.2 oz (70.4 kg)  07/18/22 155 lb 12.8 oz (70.7 kg)    Body mass index is 25.94 kg/m.  Performance status (ECOG): 1 - Symptomatic but completely ambulatory  PHYSICAL EXAM:  Physical Exam Vitals and nursing note reviewed.  Constitutional:      General: She is not in acute distress.    Appearance: Normal appearance. She is not ill-appearing.  HENT:     Head: Normocephalic and atraumatic.     Mouth/Throat:     Mouth: Mucous membranes are moist.     Pharynx: Oropharynx is clear. No oropharyngeal exudate or posterior oropharyngeal erythema.  Eyes:     General: No scleral icterus.    Extraocular Movements: Extraocular movements intact.  Conjunctiva/sclera: Conjunctivae normal.     Pupils: Pupils are equal, round, and reactive to light.  Cardiovascular:     Rate and Rhythm: Normal rate and regular rhythm.     Heart sounds: Normal heart sounds. No murmur heard.    No friction rub. No gallop.  Pulmonary:     Effort: Pulmonary effort is normal.     Breath sounds: Normal breath sounds. No wheezing, rhonchi or rales.  Abdominal:     General: There is no distension.     Palpations: Abdomen is soft. There is no hepatomegaly, splenomegaly or mass.     Tenderness: There is generalized abdominal tenderness (Mild).  Musculoskeletal:        General: Normal range of motion.     Cervical back: Normal range of motion and neck supple. No tenderness.     Right lower leg: No edema.     Left lower leg: No edema.  Lymphadenopathy:     Cervical: No cervical adenopathy.     Upper Body:     Right upper body: No supraclavicular or axillary adenopathy.     Left upper body: No  supraclavicular or axillary adenopathy.     Lower Body: No right inguinal adenopathy. No left inguinal adenopathy.  Skin:    General: Skin is warm and dry.     Coloration: Skin is not jaundiced.     Findings: No rash.  Neurological:     Mental Status: She is alert and oriented to person, place, and time.     Cranial Nerves: No cranial nerve deficit.  Psychiatric:        Mood and Affect: Mood normal.        Behavior: Behavior normal.        Thought Content: Thought content normal.     LABS:      Latest Ref Rng & Units 12/26/2022   10:44 AM 09/28/2022   10:21 AM 06/30/2022   10:02 AM  CBC  WBC 4.0 - 10.5 K/uL 10.5  8.9  9.4   Hemoglobin 12.0 - 15.0 g/dL 40.9  81.1  91.4   Hematocrit 36.0 - 46.0 % 41.3  38.3  42.6   Platelets 150 - 400 K/uL 351  293  336       Latest Ref Rng & Units 12/26/2022   10:44 AM 10/28/2021   12:00 AM  CMP  Glucose 70 - 99 mg/dL 782    BUN 8 - 23 mg/dL 13  16      Creatinine 0.44 - 1.00 mg/dL 9.56  0.6      Sodium 213 - 145 mmol/L 137  140      Potassium 3.5 - 5.1 mmol/L 4.2  4.1      Chloride 98 - 111 mmol/L 105  103      CO2 22 - 32 mmol/L 25  27      Calcium 8.9 - 10.3 mg/dL 9.7  9.3      Total Protein 6.5 - 8.1 g/dL 7.4    Total Bilirubin 0.3 - 1.2 mg/dL 0.4    Alkaline Phos 38 - 126 U/L 62  64      AST 15 - 41 U/L 18  27      ALT 0 - 44 U/L 14  14         This result is from an external source.     Lab Results  Component Value Date   TIBC 381 12/26/2022   TIBC 370 09/28/2022  TIBC 367 06/30/2022   FERRITIN 27 12/26/2022   FERRITIN 24 09/28/2022   FERRITIN 21 06/30/2022   IRONPCTSAT 22 12/26/2022   IRONPCTSAT 20 09/28/2022   IRONPCTSAT 23 06/30/2022   No results found for: "LDH"  STUDIES:  No results found.    HISTORY:   Past Medical History:  Diagnosis Date   Anxiety    Arthritis    Barrett esophagus    Chronic diarrhea    Colon polyps    Depression    Diabetes mellitus without complication (HCC)    Family history  of ovarian cancer 12/11/2021   Family history of stomach cancer 12/11/2021   GERD (gastroesophageal reflux disease)    Hyperlipemia    Hypertension    Insomnia    Lumbar radiculitis    Mitral insufficiency    Restless leg    Restless leg syndrome    Sensory urge incontinence     Past Surgical History:  Procedure Laterality Date   CARPAL TUNNEL RELEASE Left    CHOLECYSTECTOMY     COLONOSCOPY N/A 11/10/2021   Procedure: COLONOSCOPY;  Surgeon: Jaynie Collins, DO;  Location: [facility] ENDOSCOPY;  Service: Gastroenterology;  Laterality: N/A;   esophageal ablation  12/15/2022   for Barretts disease   ESOPHAGOGASTRODUODENOSCOPY N/A 11/10/2021   Procedure: ESOPHAGOGASTRODUODENOSCOPY (EGD);  Surgeon: Jaynie Collins, DO;  Location: [facility] ENDOSCOPY;  Service: Gastroenterology;  Laterality: N/A;  DM   ESOPHAGOGASTRODUODENOSCOPY (EGD) WITH PROPOFOL N/A 08/06/2019   Procedure: ESOPHAGOGASTRODUODENOSCOPY (EGD) WITH PROPOFOL;  Surgeon: Toledo, Boykin Nearing, MD;  Location: [facility] ENDOSCOPY;  Service: Gastroenterology;  Laterality: N/A;   HERNIA REPAIR     INCONTINENCE SURGERY     sling   OOPHORECTOMY     TONSILLECTOMY     VAGINAL HYSTERECTOMY     VAGINAL PROLAPSE REPAIR      Family History  Problem Relation Age of Onset   Hypertension Mother    Arthritis Mother    Heart disease Mother    Skin cancer Mother        dx after 18   Hypertension Father    Tuberculosis Father    Emphysema Father    Heart attack Father    Hyperlipidemia Father    Hypertension Sister    Skin cancer Sister        dx after 41   Stomach cancer Maternal Uncle        dx late 69s   Lung cancer Paternal Aunt    Ovarian cancer Paternal Aunt        dx after 102   Ovarian cancer Paternal Grandmother        d. late 74s   Pancreatitis Niece        d. 75   Breast cancer Neg Hx     Social History:  reports that she quit smoking about 12 years ago. Her smoking use included cigarettes. She started smoking about 52  years ago. She has a 40 pack-year smoking history. She has never used smokeless tobacco. She reports current alcohol use. She reports that she does not currently use drugs.The patient is alone today.  Allergies:  Allergies  Allergen Reactions   Morphine Nausea And Vomiting, Nausea Only and Other (See Comments)   Ace Inhibitors Other (See Comments)    Patient thinks she was light headed . She is not sure of the reaction   Ezetimibe Other (See Comments)    Leg pain    Current Medications: Current Outpatient Medications  Medication Sig Dispense Refill  FLUoxetine (PROZAC) 40 MG capsule Take 40 mg by mouth daily.     oxyCODONE (ROXICODONE) 5 MG/5ML solution SMARTSIG:Milliliter(s) By Mouth     albuterol (VENTOLIN HFA) 108 (90 Base) MCG/ACT inhaler SMARTSIG:2 Puff(s) By Mouth Every 4-6 Hours PRN     aspirin EC 81 MG tablet Take 81 mg by mouth daily. Swallow whole.     BIOTIN PO Take 1 tablet by mouth daily.     candesartan (ATACAND) 4 MG tablet Take by mouth.     carvedilol (COREG) 25 MG tablet Take by mouth.     cholecalciferol (VITAMIN D3) 10 MCG (400 UNIT) TABS tablet Take 1,000 Units by mouth.     Clobetasol Prop Emollient Base (CLOBETASOL PROPIONATE E) 0.05 % emollient cream Apply 1 Application topically at bedtime. 30 g 11   Cysteamine Bitartrate (PROCYSBI) 300 MG PACK lancets 30 gauge     empagliflozin (JARDIANCE) 10 MG TABS tablet Take 1 tablet by mouth daily.     estradiol (ESTRACE) 0.1 MG/GM vaginal cream Place 0.5g nightly twice a week 30 g 11   FLUoxetine (PROZAC) 20 MG capsule      loratadine (CLARITIN) 10 MG tablet Take 10 mg by mouth daily.     LORazepam (ATIVAN) 0.5 MG tablet   0   metFORMIN (GLUCOPHAGE-XR) 750 MG 24 hr tablet Take 750 mg by mouth 2 (two) times daily.     Multiple Vitamins-Minerals (MULTIPLE VITAMINS/WOMENS PO) Take 1 tablet by mouth daily.     ONETOUCH VERIO test strip daily.     pantoprazole (PROTONIX) 40 MG tablet 40 mg 2 (two) times daily.      Probiotic Product (PROBIOTIC BLEND PO) Take 1 tablet by mouth daily.     rosuvastatin (CRESTOR) 5 MG tablet Take 5 mg by mouth daily.     spironolactone (ALDACTONE) 25 MG tablet Take 1 tablet by mouth daily.     sucralfate (CARAFATE) 1 GM/10ML suspension SMARTSIG:Milliliter(s) By Mouth (Patient not taking: Reported on 12/26/2022)     vitamin B-12 (CYANOCOBALAMIN) 250 MCG tablet Take 2,500 mcg by mouth daily.     No current facility-administered medications for this visit.

## 2022-12-27 ENCOUNTER — Telehealth: Payer: Self-pay

## 2022-12-27 NOTE — Telephone Encounter (Signed)
PATIENT NOTIFIED OF LAB RESULTS MESSAGE SENT TO [facility] TO SCHEDULE F/U APPT.

## 2022-12-27 NOTE — Telephone Encounter (Signed)
Patient notified and voiced understanding.

## 2022-12-27 NOTE — Telephone Encounter (Signed)
-----   Message from Adah Perl sent at 12/26/2022  8:00 PM EDT ----- Please let her know her hemoglobin is normal and iron studies normal. I would like to see her again in 3 months. Thanks

## 2023-01-02 DIAGNOSIS — Z01818 Encounter for other preprocedural examination: Secondary | ICD-10-CM | POA: Diagnosis not present

## 2023-01-02 DIAGNOSIS — K22710 Barrett's esophagus with low grade dysplasia: Secondary | ICD-10-CM | POA: Diagnosis not present

## 2023-01-02 DIAGNOSIS — I447 Left bundle-branch block, unspecified: Secondary | ICD-10-CM | POA: Diagnosis not present

## 2023-01-02 DIAGNOSIS — D509 Iron deficiency anemia, unspecified: Secondary | ICD-10-CM | POA: Diagnosis not present

## 2023-01-02 DIAGNOSIS — E119 Type 2 diabetes mellitus without complications: Secondary | ICD-10-CM | POA: Diagnosis not present

## 2023-01-02 DIAGNOSIS — I428 Other cardiomyopathies: Secondary | ICD-10-CM | POA: Diagnosis not present

## 2023-01-02 DIAGNOSIS — I10 Essential (primary) hypertension: Secondary | ICD-10-CM | POA: Diagnosis not present

## 2023-01-02 DIAGNOSIS — K219 Gastro-esophageal reflux disease without esophagitis: Secondary | ICD-10-CM | POA: Diagnosis not present

## 2023-01-09 DIAGNOSIS — R9431 Abnormal electrocardiogram [ECG] [EKG]: Secondary | ICD-10-CM | POA: Diagnosis not present

## 2023-01-09 DIAGNOSIS — I5023 Acute on chronic systolic (congestive) heart failure: Secondary | ICD-10-CM | POA: Diagnosis not present

## 2023-01-09 DIAGNOSIS — Z7951 Long term (current) use of inhaled steroids: Secondary | ICD-10-CM | POA: Diagnosis not present

## 2023-01-09 DIAGNOSIS — K219 Gastro-esophageal reflux disease without esophagitis: Secondary | ICD-10-CM | POA: Diagnosis not present

## 2023-01-09 DIAGNOSIS — I5022 Chronic systolic (congestive) heart failure: Secondary | ICD-10-CM | POA: Diagnosis not present

## 2023-01-09 DIAGNOSIS — I447 Left bundle-branch block, unspecified: Secondary | ICD-10-CM | POA: Diagnosis not present

## 2023-01-09 DIAGNOSIS — Z79899 Other long term (current) drug therapy: Secondary | ICD-10-CM | POA: Diagnosis not present

## 2023-01-09 DIAGNOSIS — I420 Dilated cardiomyopathy: Secondary | ICD-10-CM | POA: Diagnosis not present

## 2023-01-09 DIAGNOSIS — Z7982 Long term (current) use of aspirin: Secondary | ICD-10-CM | POA: Diagnosis not present

## 2023-01-09 DIAGNOSIS — E119 Type 2 diabetes mellitus without complications: Secondary | ICD-10-CM | POA: Diagnosis not present

## 2023-01-09 DIAGNOSIS — I5043 Acute on chronic combined systolic (congestive) and diastolic (congestive) heart failure: Secondary | ICD-10-CM | POA: Diagnosis not present

## 2023-01-09 DIAGNOSIS — Z87891 Personal history of nicotine dependence: Secondary | ICD-10-CM | POA: Diagnosis not present

## 2023-01-09 DIAGNOSIS — Z7984 Long term (current) use of oral hypoglycemic drugs: Secondary | ICD-10-CM | POA: Diagnosis not present

## 2023-01-09 DIAGNOSIS — Z8249 Family history of ischemic heart disease and other diseases of the circulatory system: Secondary | ICD-10-CM | POA: Diagnosis not present

## 2023-01-10 DIAGNOSIS — I428 Other cardiomyopathies: Secondary | ICD-10-CM | POA: Diagnosis not present

## 2023-01-10 DIAGNOSIS — K219 Gastro-esophageal reflux disease without esophagitis: Secondary | ICD-10-CM | POA: Diagnosis not present

## 2023-01-10 DIAGNOSIS — I447 Left bundle-branch block, unspecified: Secondary | ICD-10-CM | POA: Diagnosis not present

## 2023-01-10 DIAGNOSIS — Z7982 Long term (current) use of aspirin: Secondary | ICD-10-CM | POA: Diagnosis not present

## 2023-01-10 DIAGNOSIS — Z8249 Family history of ischemic heart disease and other diseases of the circulatory system: Secondary | ICD-10-CM | POA: Diagnosis not present

## 2023-01-10 DIAGNOSIS — E119 Type 2 diabetes mellitus without complications: Secondary | ICD-10-CM | POA: Diagnosis not present

## 2023-01-10 DIAGNOSIS — I420 Dilated cardiomyopathy: Secondary | ICD-10-CM | POA: Diagnosis not present

## 2023-01-10 DIAGNOSIS — Z87891 Personal history of nicotine dependence: Secondary | ICD-10-CM | POA: Diagnosis not present

## 2023-01-10 DIAGNOSIS — Z7984 Long term (current) use of oral hypoglycemic drugs: Secondary | ICD-10-CM | POA: Diagnosis not present

## 2023-01-10 DIAGNOSIS — I501 Left ventricular failure: Secondary | ICD-10-CM | POA: Diagnosis not present

## 2023-01-10 DIAGNOSIS — Z452 Encounter for adjustment and management of vascular access device: Secondary | ICD-10-CM | POA: Diagnosis not present

## 2023-01-10 DIAGNOSIS — Z95810 Presence of automatic (implantable) cardiac defibrillator: Secondary | ICD-10-CM | POA: Diagnosis not present

## 2023-01-10 DIAGNOSIS — R9431 Abnormal electrocardiogram [ECG] [EKG]: Secondary | ICD-10-CM | POA: Diagnosis not present

## 2023-01-10 DIAGNOSIS — I5043 Acute on chronic combined systolic (congestive) and diastolic (congestive) heart failure: Secondary | ICD-10-CM | POA: Diagnosis not present

## 2023-01-10 DIAGNOSIS — Z7951 Long term (current) use of inhaled steroids: Secondary | ICD-10-CM | POA: Diagnosis not present

## 2023-01-10 DIAGNOSIS — Z79899 Other long term (current) drug therapy: Secondary | ICD-10-CM | POA: Diagnosis not present

## 2023-02-14 DIAGNOSIS — I5189 Other ill-defined heart diseases: Secondary | ICD-10-CM | POA: Diagnosis not present

## 2023-02-14 DIAGNOSIS — I10 Essential (primary) hypertension: Secondary | ICD-10-CM | POA: Diagnosis not present

## 2023-02-14 DIAGNOSIS — G2581 Restless legs syndrome: Secondary | ICD-10-CM | POA: Diagnosis not present

## 2023-02-14 DIAGNOSIS — E7800 Pure hypercholesterolemia, unspecified: Secondary | ICD-10-CM | POA: Diagnosis not present

## 2023-02-14 DIAGNOSIS — I5022 Chronic systolic (congestive) heart failure: Secondary | ICD-10-CM | POA: Diagnosis not present

## 2023-02-14 DIAGNOSIS — E538 Deficiency of other specified B group vitamins: Secondary | ICD-10-CM | POA: Diagnosis not present

## 2023-02-14 DIAGNOSIS — D509 Iron deficiency anemia, unspecified: Secondary | ICD-10-CM | POA: Diagnosis not present

## 2023-02-14 DIAGNOSIS — M85851 Other specified disorders of bone density and structure, right thigh: Secondary | ICD-10-CM | POA: Diagnosis not present

## 2023-02-14 DIAGNOSIS — Z9181 History of falling: Secondary | ICD-10-CM | POA: Diagnosis not present

## 2023-02-14 DIAGNOSIS — Z79899 Other long term (current) drug therapy: Secondary | ICD-10-CM | POA: Diagnosis not present

## 2023-02-14 DIAGNOSIS — Z139 Encounter for screening, unspecified: Secondary | ICD-10-CM | POA: Diagnosis not present

## 2023-02-14 DIAGNOSIS — E1169 Type 2 diabetes mellitus with other specified complication: Secondary | ICD-10-CM | POA: Diagnosis not present

## 2023-02-20 DIAGNOSIS — I447 Left bundle-branch block, unspecified: Secondary | ICD-10-CM | POA: Diagnosis not present

## 2023-02-20 DIAGNOSIS — I10 Essential (primary) hypertension: Secondary | ICD-10-CM | POA: Diagnosis not present

## 2023-02-20 DIAGNOSIS — I5022 Chronic systolic (congestive) heart failure: Secondary | ICD-10-CM | POA: Diagnosis not present

## 2023-02-20 DIAGNOSIS — I428 Other cardiomyopathies: Secondary | ICD-10-CM | POA: Diagnosis not present

## 2023-02-20 DIAGNOSIS — Z95810 Presence of automatic (implantable) cardiac defibrillator: Secondary | ICD-10-CM | POA: Diagnosis not present

## 2023-02-28 ENCOUNTER — Other Ambulatory Visit: Payer: Self-pay | Admitting: Family Medicine

## 2023-02-28 DIAGNOSIS — Z1231 Encounter for screening mammogram for malignant neoplasm of breast: Secondary | ICD-10-CM

## 2023-03-13 DIAGNOSIS — Z139 Encounter for screening, unspecified: Secondary | ICD-10-CM | POA: Diagnosis not present

## 2023-03-13 DIAGNOSIS — Z1231 Encounter for screening mammogram for malignant neoplasm of breast: Secondary | ICD-10-CM | POA: Diagnosis not present

## 2023-03-13 DIAGNOSIS — Z0000 Encounter for general adult medical examination without abnormal findings: Secondary | ICD-10-CM | POA: Diagnosis not present

## 2023-03-13 DIAGNOSIS — Z9181 History of falling: Secondary | ICD-10-CM | POA: Diagnosis not present

## 2023-03-16 ENCOUNTER — Other Ambulatory Visit: Payer: Self-pay | Admitting: Family Medicine

## 2023-03-16 DIAGNOSIS — R911 Solitary pulmonary nodule: Secondary | ICD-10-CM

## 2023-03-21 ENCOUNTER — Ambulatory Visit
Admission: RE | Admit: 2023-03-21 | Discharge: 2023-03-21 | Disposition: A | Discharge status: Home / Self Care | Payer: 59 | Source: Ambulatory Visit | Attending: Family Medicine | Admitting: Family Medicine

## 2023-03-21 DIAGNOSIS — R911 Solitary pulmonary nodule: Secondary | ICD-10-CM | POA: Insufficient documentation

## 2023-04-05 ENCOUNTER — Inpatient Hospital Stay: Payer: 59 | Attending: Hematology and Oncology | Admitting: Hematology and Oncology

## 2023-04-05 ENCOUNTER — Telehealth: Payer: Self-pay

## 2023-04-05 ENCOUNTER — Encounter: Payer: Self-pay | Admitting: Hematology and Oncology

## 2023-04-05 ENCOUNTER — Inpatient Hospital Stay: Payer: 59

## 2023-04-05 VITALS — BP 122/60 | HR 79 | Temp 98.1°F | Resp 20 | Ht 63.7 in | Wt 153.2 lb

## 2023-04-05 DIAGNOSIS — D509 Iron deficiency anemia, unspecified: Secondary | ICD-10-CM

## 2023-04-05 LAB — CMP (CANCER CENTER ONLY)
ALT: 8 U/L (ref 0–44)
AST: 15 U/L (ref 15–41)
Albumin: 4.3 g/dL (ref 3.5–5.0)
Alkaline Phosphatase: 67 U/L (ref 38–126)
Anion gap: 12 (ref 5–15)
BUN: 20 mg/dL (ref 8–23)
CO2: 23 mmol/L (ref 22–32)
Calcium: 10.4 mg/dL — ABNORMAL HIGH (ref 8.9–10.3)
Chloride: 103 mmol/L (ref 98–111)
Creatinine: 0.9 mg/dL (ref 0.44–1.00)
GFR, Estimated: >60 mL/min (ref >60)
Glucose, Bld: 156 mg/dL — ABNORMAL HIGH (ref 70–99)
Potassium: 4.6 mmol/L (ref 3.5–5.1)
Sodium: 138 mmol/L (ref 135–145)
Total Bilirubin: 0.3 mg/dL (ref <1.2)
Total Protein: 7.1 g/dL (ref 6.5–8.1)

## 2023-04-05 LAB — CBC WITH DIFFERENTIAL (CANCER CENTER ONLY)
Abs Immature Granulocytes: 0.02 10*3/uL (ref 0.00–0.07)
Basophils Absolute: 0.1 10*3/uL (ref 0.0–0.1)
Basophils Relative: 1 %
Eosinophils Absolute: 0.3 10*3/uL (ref 0.0–0.5)
Eosinophils Relative: 3 %
HCT: 41.1 % (ref 36.0–46.0)
Hemoglobin: 13.6 g/dL (ref 12.0–15.0)
Immature Granulocytes: 0 %
Lymphocytes Relative: 26 %
Lymphs Abs: 2.7 10*3/uL (ref 0.7–4.0)
MCH: 28.9 pg (ref 26.0–34.0)
MCHC: 33.1 g/dL (ref 30.0–36.0)
MCV: 87.4 fL (ref 80.0–100.0)
Monocytes Absolute: 0.9 10*3/uL (ref 0.1–1.0)
Monocytes Relative: 8 %
Neutro Abs: 6.5 10*3/uL (ref 1.7–7.7)
Neutrophils Relative %: 62 %
Platelet Count: 289 10*3/uL (ref 150–400)
RBC: 4.7 MIL/uL (ref 3.87–5.11)
RDW: 13.3 % (ref 11.5–15.5)
WBC Count: 10.5 10*3/uL (ref 4.0–10.5)
nRBC: 0 % (ref 0.0–0.2)
nRBC: 0 /100{WBCs}

## 2023-04-05 LAB — IRON AND TIBC
Iron: 70 ug/dL (ref 28–170)
Saturation Ratios: 16 % (ref 10.4–31.8)
TIBC: 428 ug/dL (ref 250–450)
UIBC: 358 ug/dL

## 2023-04-05 LAB — FERRITIN: Ferritin: 15 ng/mL (ref 11–307)

## 2023-04-05 NOTE — Telephone Encounter (Signed)
-----   Message from Adah Perl sent at 04/05/2023 11:34 AM EST ----- Please fax labs to Dr. Nathanial Rancher. Thanks

## 2023-04-05 NOTE — Progress Notes (Signed)
[facility]  [address] [place],  [place]  [address] [phone]  Clinic Day:  04/05/2023  Referring physician: Ailene Ravel, MD  ASSESSMENT & PLAN:   Assessment & Plan: Iron deficiency anemia Iron deficiency anemia diagnosed in May 2023 felt to be due to chronic GI losses.  She did not tolerate oral iron supplementation.  She received IV iron in the form of Feraheme in June 2023.  She underwent EGD and colonoscopy at the end of June and no source of bleeding was found.  She had removal of stomach polyps, which were benign.  She was found to have Barrett esophagus.  She also had removal of colon polyps, which were hyperplastic polyps. Repeat EGD at [facility] in January revealed more advanced appearing lesion. Endoscopic submucosal dissection was scheduled, but not done due to EKG abnormalities.  She underwent endoscopic submucosal resection in May 2024.  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  She continues to follow with GI and had repeat EGD on August 2 and had the Barrett esophagus treated with RFA.  She is scheduled again at [facility] in December.  She is taking a multivitamin with iron.  Her hemoglobin remains normal, but ferritin has decreased.  I will continue observation and plan to see her back in 3 months for repeat clinical assessment.    The patient understands the plans discussed today and is in agreement with them.  She knows to contact our office if she develops concerns prior to her next appointment.   I provided 20 minutes of face-to-face time during this encounter and > 50% was spent counseling as documented under my assessment and plan.    Adah Perl, PA-C  [facility]  CANCER CENTER - A DEPT OF [facility] [address] [place] [address] Dept: [phone] Dept Fax: [phone]   Orders Placed This  Encounter  Procedures   CBC with Differential (Cancer Center Only)    Standing Status:   Future    Standing Expiration Date:   04/04/2024   CMP (Cancer Center only)    Standing Status:   Future    Standing Expiration Date:   04/04/2024   Ferritin    Standing Status:   Future    Standing Expiration Date:   04/04/2024   Iron and TIBC    Standing Status:   Future    Standing Expiration Date:   04/04/2024      CHIEF COMPLAINT:  CC: Iron deficiency anemia  Current Treatment:  Observation  HISTORY OF PRESENT ILLNESS:  Jacqueline Alexander is a 72 y.o. female with iron deficiency anemia who I began seeing in June 2023.  The patient had evidence of iron deficiency dating back to June 2022. She does not tolerate oral iron due to constipation, so was referred to [place] for IV iron.  She received IV Feraheme in June with normalization of her hemoglobin and iron stores.  She has not had recurrent anemia.  She was taking oral B12 and her B12 level was elevated in November, so we had her discontinue B12 supplement.   She has a history of Barrett's esophagus and her last EGD was in March 2021 at [facility] in [place].  This revealed persistent Barrett's disease. She continues pantoprazole 20 mg daily.  Her last colonoscopy was in January 2018 at [facility].  This was normal, so follow-up in  10 years was recommended.  The patient has never had gastric surgery.  She underwent total hysterectomy and bilateral salpingo-oophorectomy with bladder tacking at age 30 for bladder and uterine prolapse and denies vaginal bleeding. She is on aspirin 81 mg daily.  She denied heavy alcohol use, but does drink socially.     She was referred to [facility] for follow-up of her Barrett's disease and was seen in January.  EGD revealed more advanced changes of Barrett's, so endoscopic submucosal dissection was scheduled.  This procedure was not done due to findings of a left bundle branch block preop EKG.  She saw her  cardiologist and had an echocardiogram.underwent endoscopic submucosal resection in May.  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    INTERVAL HISTORY:  Jacqueline Alexander is here today for repeat clinical assessment.  She underwent defibrillator placement in August.  She states her fatigue has improved.  She denies pica to ice.  She reports mild shortness of breath and chest tightness with exertion.  She denies leg swelling. She states she has occasional difficulty swallowing.  She reports continued alternating diarrhea and constipation with occasional incontinence.  She denies fevers or chills. She denies pain. Her appetite is good. Her weight has increased 4 pounds over last 3 months .  She underwent endoscopy with RFA treatment with these again in August at [facility].  She states she is scheduled to follow-up with GI again in December.  REVIEW OF SYSTEMS:  Review of Systems  Constitutional:  Positive for fatigue (improving). Negative for appetite change, chills, fever and unexpected weight change.  HENT:   Positive for trouble swallowing (occasional). Negative for lump/mass, mouth sores and sore throat.   Respiratory:  Positive for chest tightness (occasionally with exertion) and shortness of breath (occasionally with exertion). Negative for cough.   Cardiovascular:  Negative for chest pain and leg swelling.  Gastrointestinal:  Positive for diarrhea and nausea. Negative for abdominal distention, abdominal pain, blood in stool, constipation, rectal pain and vomiting.  Genitourinary:  Negative for difficulty urinating, dysuria, frequency and hematuria.   Musculoskeletal:  Negative for arthralgias, back pain, gait problem and myalgias.  Skin:  Negative for itching and rash.  Neurological:  Negative for dizziness, gait problem, headaches and light-headedness.  Hematological:  Negative for adenopathy. Does not bruise/bleed  easily.  Psychiatric/Behavioral:  Negative for depression and sleep disturbance. The patient is not nervous/anxious.      VITALS:  Blood pressure 122/60, pulse 79, temperature 98.1 F (36.7 C), temperature source Oral, resp. rate 20, height 5' 3.7" (1.618 m), weight 153 lb 3.2 oz (69.5 kg), SpO2 97%.  Wt Readings from Last 3 Encounters:  04/05/23 153 lb 3.2 oz (69.5 kg)  12/26/22 149 lb 11.2 oz (67.9 kg)  09/28/22 155 lb 3.2 oz (70.4 kg)    Body mass index is 26.54 kg/m.  Performance status (ECOG): 1 - Symptomatic but completely ambulatory  PHYSICAL EXAM:  Physical Exam Vitals and nursing note reviewed.  Constitutional:      General: She is not in acute distress.    Appearance: Normal appearance.  HENT:     Head: Normocephalic and atraumatic.     Mouth/Throat:     Mouth: Mucous membranes are moist.     Pharynx: Oropharynx is clear. No oropharyngeal exudate or posterior oropharyngeal erythema.  Eyes:     General: No scleral icterus.    Extraocular Movements: Extraocular movements intact.  Conjunctiva/sclera: Conjunctivae normal.     Pupils: Pupils are equal, round, and reactive to light.  Cardiovascular:     Rate and Rhythm: Normal rate and regular rhythm.     Heart sounds: Normal heart sounds. No murmur heard.    No friction rub. No gallop.  Pulmonary:     Effort: Pulmonary effort is normal.     Breath sounds: Normal breath sounds. No wheezing, rhonchi or rales.  Abdominal:     General: There is no distension.     Palpations: Abdomen is soft. There is no hepatomegaly, splenomegaly or mass.     Tenderness: There is no abdominal tenderness.  Musculoskeletal:        General: Normal range of motion.     Cervical back: Normal range of motion and neck supple. No tenderness.     Right lower leg: No edema.     Left lower leg: No edema.  Lymphadenopathy:     Cervical: No cervical adenopathy.     Upper Body:     Right upper body: No supraclavicular or axillary adenopathy.      Left upper body: No supraclavicular or axillary adenopathy.     Lower Body: No right inguinal adenopathy. No left inguinal adenopathy.  Skin:    General: Skin is warm and dry.     Coloration: Skin is not jaundiced.     Findings: No rash.  Neurological:     Mental Status: She is alert and oriented to person, place, and time.     Cranial Nerves: No cranial nerve deficit.  Psychiatric:        Mood and Affect: Mood normal.        Behavior: Behavior normal.        Thought Content: Thought content normal.     LABS:      Latest Ref Rng & Units 04/05/2023   10:43 AM 12/26/2022   10:44 AM 09/28/2022   10:21 AM  CBC  WBC 4.0 - 10.5 K/uL 10.5  10.5  8.9   Hemoglobin 12.0 - 15.0 g/dL 16.1  09.6  04.5   Hematocrit 36.0 - 46.0 % 41.1  41.3  38.3   Platelets 150 - 400 K/uL 289  351  293       Latest Ref Rng & Units 04/05/2023   10:43 AM 12/26/2022   10:44 AM 10/28/2021   12:00 AM  CMP  Glucose 70 - 99 mg/dL 409  811    BUN 8 - 23 mg/dL 20  13  16       Creatinine 0.44 - 1.00 mg/dL 9.14  7.82  0.6      Sodium 135 - 145 mmol/L 138  137  140      Potassium 3.5 - 5.1 mmol/L 4.6  4.2  4.1      Chloride 98 - 111 mmol/L 103  105  103      CO2 22 - 32 mmol/L 23  25  27       Calcium 8.9 - 10.3 mg/dL 95.6  9.7  9.3      Total Protein 6.5 - 8.1 g/dL 7.1  7.4    Total Bilirubin <1.2 mg/dL 0.3  0.4    Alkaline Phos 38 - 126 U/L 67  62  64      AST 15 - 41 U/L 15  18  27       ALT 0 - 44 U/L 8  14  14          This  result is from an external source.     Lab Results  Component Value Date   TIBC 428 04/05/2023   TIBC 381 12/26/2022   TIBC 370 09/28/2022   FERRITIN 15 04/05/2023   FERRITIN 27 12/26/2022   FERRITIN 24 09/28/2022   IRONPCTSAT 16 04/05/2023   IRONPCTSAT 22 12/26/2022   IRONPCTSAT 20 09/28/2022     STUDIES:  No results found.    HISTORY:   Past Medical History:  Diagnosis Date   Anxiety    Arthritis    Barrett esophagus    Chronic diarrhea    Colon polyps     Depression    Diabetes mellitus without complication (HCC)    Family history of ovarian cancer 12/11/2021   Family history of stomach cancer 12/11/2021   GERD (gastroesophageal reflux disease)    Hyperlipemia    Hypertension    Insomnia    Lumbar radiculitis    Mitral insufficiency    Restless leg    Restless leg syndrome    Sensory urge incontinence     Past Surgical History:  Procedure Laterality Date   CARPAL TUNNEL RELEASE Left    CHOLECYSTECTOMY     COLONOSCOPY N/A 11/10/2021   Procedure: COLONOSCOPY;  Surgeon: Jaynie Collins, DO;  Location: [facility] ENDOSCOPY;  Service: Gastroenterology;  Laterality: N/A;   defibulator     August 2024 at [facility]   esophageal ablation  12/15/2022   for Barretts disease   ESOPHAGOGASTRODUODENOSCOPY N/A 11/10/2021   Procedure: ESOPHAGOGASTRODUODENOSCOPY (EGD);  Surgeon: Jaynie Collins, DO;  Location: [facility] ENDOSCOPY;  Service: Gastroenterology;  Laterality: N/A;  DM   ESOPHAGOGASTRODUODENOSCOPY (EGD) WITH PROPOFOL N/A 08/06/2019   Procedure: ESOPHAGOGASTRODUODENOSCOPY (EGD) WITH PROPOFOL;  Surgeon: Toledo, Boykin Nearing, MD;  Location: [facility] ENDOSCOPY;  Service: Gastroenterology;  Laterality: N/A;   HERNIA REPAIR     INCONTINENCE SURGERY     sling   OOPHORECTOMY     TONSILLECTOMY     VAGINAL HYSTERECTOMY     VAGINAL PROLAPSE REPAIR      Family History  Problem Relation Age of Onset   Hypertension Mother    Arthritis Mother    Heart disease Mother    Skin cancer Mother        dx after 56   Hypertension Father    Tuberculosis Father    Emphysema Father    Heart attack Father    Hyperlipidemia Father    Hypertension Sister    Skin cancer Sister        dx after 41   Stomach cancer Maternal Uncle        dx late 72s   Lung cancer Paternal Aunt    Ovarian cancer Paternal Aunt        dx after 69   Ovarian cancer Paternal Grandmother        d. late 50s   Pancreatitis Niece        d. 41   Breast cancer Neg Hx     Social  History:  reports that she quit smoking about 12 years ago. Her smoking use included cigarettes. She started smoking about 52 years ago. She has a 40 pack-year smoking history. She has never used smokeless tobacco. She reports current alcohol use. She reports that she does not currently use drugs.The patient is alone today.  Allergies:  Allergies  Allergen Reactions   Morphine Nausea And Vomiting, Nausea Only and Other (See Comments)   Ace Inhibitors Other (See Comments)    Patient thinks she  was light headed . She is not sure of the reaction   Ezetimibe Other (See Comments)    Leg pain    Current Medications: Current Outpatient Medications  Medication Sig Dispense Refill   albuterol (VENTOLIN HFA) 108 (90 Base) MCG/ACT inhaler SMARTSIG:2 Puff(s) By Mouth Every 4-6 Hours PRN     aspirin EC 81 MG tablet Take 81 mg by mouth daily. Swallow whole.     BIOTIN PO Take 1 tablet by mouth daily.     candesartan (ATACAND) 4 MG tablet Take by mouth.     carvedilol (COREG) 25 MG tablet Take by mouth.     cholecalciferol (VITAMIN D3) 10 MCG (400 UNIT) TABS tablet Take 1,000 Units by mouth.     Clobetasol Prop Emollient Base (CLOBETASOL PROPIONATE E) 0.05 % emollient cream Apply 1 Application topically at bedtime. 30 g 11   Cysteamine Bitartrate (PROCYSBI) 300 MG PACK lancets 30 gauge     empagliflozin (JARDIANCE) 10 MG TABS tablet Take 1 tablet by mouth daily.     estradiol (ESTRACE) 0.1 MG/GM vaginal cream Place 0.5g nightly twice a week 30 g 11   FLUoxetine (PROZAC) 20 MG capsule Take 20 mg by mouth daily. 3 tablets for total dose of 60 mg a day     glucose blood test strip TEST SUGAR THREE TIMES DAILY     loratadine (CLARITIN) 10 MG tablet Take 10 mg by mouth daily.     LORazepam (ATIVAN) 0.5 MG tablet   0   metFORMIN (GLUCOPHAGE-XR) 750 MG 24 hr tablet Take 750 mg by mouth 2 (two) times daily.     Multiple Vitamins-Minerals (MULTIPLE VITAMINS/WOMENS PO) Take 1 tablet by mouth daily.      ONETOUCH VERIO test strip daily.     pantoprazole (PROTONIX) 40 MG tablet 40 mg 2 (two) times daily.     Probiotic Product (PROBIOTIC BLEND PO) Take 1 tablet by mouth daily.     rosuvastatin (CRESTOR) 5 MG tablet Take 5 mg by mouth daily.     spironolactone (ALDACTONE) 25 MG tablet Take 1 tablet by mouth daily.     sucralfate (CARAFATE) 1 GM/10ML suspension SMARTSIG:Milliliter(s) By Mouth (Patient not taking: Reported on 12/26/2022)     vitamin B-12 (CYANOCOBALAMIN) 250 MCG tablet Take 2,500 mcg by mouth daily.     No current facility-administered medications for this visit.

## 2023-04-05 NOTE — Telephone Encounter (Signed)
Copies faxed.

## 2023-04-05 NOTE — Assessment & Plan Note (Signed)
Iron deficiency anemia diagnosed in May 2023 felt to be due to chronic GI losses.  She did not tolerate oral iron supplementation.  She received IV iron in the form of Feraheme in June 2023.  She underwent EGD and colonoscopy at the end of June and no source of bleeding was found.  She had removal of stomach polyps, which were benign.  She was found to have Barrett esophagus.  She also had removal of colon polyps, which were hyperplastic polyps. Repeat EGD at [facility] in January revealed more advanced appearing lesion. Endoscopic submucosal dissection was scheduled, but not done due to EKG abnormalities.  She underwent endoscopic submucosal resection in May 2024.  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  She continues to follow with GI and had repeat EGD on August 2 and had the Barrett esophagus treated with RFA.  She is scheduled again at [facility] in December.  She is taking a multivitamin with iron.  Her hemoglobin remains normal, but ferritin has decreased.  I will continue observation and plan to see her back in 3 months for repeat clinical assessment.

## 2023-04-08 ENCOUNTER — Encounter: Payer: Self-pay | Admitting: Hematology and Oncology

## 2023-04-09 DIAGNOSIS — K22710 Barrett's esophagus with low grade dysplasia: Secondary | ICD-10-CM | POA: Diagnosis not present

## 2023-04-09 DIAGNOSIS — I5022 Chronic systolic (congestive) heart failure: Secondary | ICD-10-CM | POA: Diagnosis not present

## 2023-04-09 DIAGNOSIS — Z01818 Encounter for other preprocedural examination: Secondary | ICD-10-CM | POA: Diagnosis not present

## 2023-04-09 DIAGNOSIS — Z95810 Presence of automatic (implantable) cardiac defibrillator: Secondary | ICD-10-CM | POA: Diagnosis not present

## 2023-04-09 DIAGNOSIS — I447 Left bundle-branch block, unspecified: Secondary | ICD-10-CM | POA: Diagnosis not present

## 2023-04-09 DIAGNOSIS — I428 Other cardiomyopathies: Secondary | ICD-10-CM | POA: Diagnosis not present

## 2023-04-10 DIAGNOSIS — I5022 Chronic systolic (congestive) heart failure: Secondary | ICD-10-CM | POA: Diagnosis not present

## 2023-04-10 DIAGNOSIS — I447 Left bundle-branch block, unspecified: Secondary | ICD-10-CM | POA: Diagnosis not present

## 2023-04-10 DIAGNOSIS — I10 Essential (primary) hypertension: Secondary | ICD-10-CM | POA: Diagnosis not present

## 2023-04-10 DIAGNOSIS — I428 Other cardiomyopathies: Secondary | ICD-10-CM | POA: Diagnosis not present

## 2023-04-10 DIAGNOSIS — Z95810 Presence of automatic (implantable) cardiac defibrillator: Secondary | ICD-10-CM | POA: Diagnosis not present

## 2023-04-16 ENCOUNTER — Ambulatory Visit
Admission: RE | Admit: 2023-04-16 | Discharge: 2023-04-16 | Disposition: A | Discharge status: Home / Self Care | Payer: 59 | Source: Ambulatory Visit | Attending: Family Medicine | Admitting: Family Medicine

## 2023-04-16 DIAGNOSIS — Z1231 Encounter for screening mammogram for malignant neoplasm of breast: Secondary | ICD-10-CM | POA: Insufficient documentation

## 2023-04-23 DIAGNOSIS — I5022 Chronic systolic (congestive) heart failure: Secondary | ICD-10-CM | POA: Diagnosis not present

## 2023-04-23 DIAGNOSIS — Z87891 Personal history of nicotine dependence: Secondary | ICD-10-CM | POA: Diagnosis not present

## 2023-04-23 DIAGNOSIS — Z9889 Other specified postprocedural states: Secondary | ICD-10-CM | POA: Diagnosis not present

## 2023-04-23 DIAGNOSIS — K219 Gastro-esophageal reflux disease without esophagitis: Secondary | ICD-10-CM | POA: Diagnosis not present

## 2023-04-23 DIAGNOSIS — E119 Type 2 diabetes mellitus without complications: Secondary | ICD-10-CM | POA: Diagnosis not present

## 2023-04-23 DIAGNOSIS — I447 Left bundle-branch block, unspecified: Secondary | ICD-10-CM | POA: Diagnosis not present

## 2023-04-23 DIAGNOSIS — E1142 Type 2 diabetes mellitus with diabetic polyneuropathy: Secondary | ICD-10-CM | POA: Diagnosis not present

## 2023-04-23 DIAGNOSIS — K2950 Unspecified chronic gastritis without bleeding: Secondary | ICD-10-CM | POA: Diagnosis not present

## 2023-04-23 DIAGNOSIS — Z79899 Other long term (current) drug therapy: Secondary | ICD-10-CM | POA: Diagnosis not present

## 2023-04-23 DIAGNOSIS — K22711 Barrett's esophagus with high grade dysplasia: Secondary | ICD-10-CM | POA: Diagnosis not present

## 2023-04-23 DIAGNOSIS — Z7984 Long term (current) use of oral hypoglycemic drugs: Secondary | ICD-10-CM | POA: Diagnosis not present

## 2023-04-23 DIAGNOSIS — G2581 Restless legs syndrome: Secondary | ICD-10-CM | POA: Diagnosis not present

## 2023-04-23 DIAGNOSIS — E785 Hyperlipidemia, unspecified: Secondary | ICD-10-CM | POA: Diagnosis not present

## 2023-04-23 DIAGNOSIS — K2289 Other specified disease of esophagus: Secondary | ICD-10-CM | POA: Diagnosis not present

## 2023-04-23 DIAGNOSIS — I428 Other cardiomyopathies: Secondary | ICD-10-CM | POA: Diagnosis not present

## 2023-04-23 DIAGNOSIS — Z95810 Presence of automatic (implantable) cardiac defibrillator: Secondary | ICD-10-CM | POA: Diagnosis not present

## 2023-04-23 DIAGNOSIS — Z7982 Long term (current) use of aspirin: Secondary | ICD-10-CM | POA: Diagnosis not present

## 2023-04-23 DIAGNOSIS — I110 Hypertensive heart disease with heart failure: Secondary | ICD-10-CM | POA: Diagnosis not present

## 2023-04-23 DIAGNOSIS — R54 Age-related physical debility: Secondary | ICD-10-CM | POA: Diagnosis not present

## 2023-04-25 ENCOUNTER — Encounter: Payer: Self-pay | Admitting: Genetic Counselor

## 2023-04-25 NOTE — Progress Notes (Signed)
Update: The LZTR1 VUS (p.R619C) has been reclassified to likely benign.

## 2023-05-10 DIAGNOSIS — H43813 Vitreous degeneration, bilateral: Secondary | ICD-10-CM | POA: Diagnosis not present

## 2023-05-10 DIAGNOSIS — E119 Type 2 diabetes mellitus without complications: Secondary | ICD-10-CM | POA: Diagnosis not present

## 2023-05-10 DIAGNOSIS — H2512 Age-related nuclear cataract, left eye: Secondary | ICD-10-CM | POA: Diagnosis not present

## 2023-05-10 DIAGNOSIS — H2511 Age-related nuclear cataract, right eye: Secondary | ICD-10-CM | POA: Diagnosis not present

## 2023-06-21 ENCOUNTER — Other Ambulatory Visit: Payer: Self-pay | Admitting: Family Medicine

## 2023-06-21 DIAGNOSIS — E538 Deficiency of other specified B group vitamins: Secondary | ICD-10-CM | POA: Diagnosis not present

## 2023-06-21 DIAGNOSIS — I10 Essential (primary) hypertension: Secondary | ICD-10-CM | POA: Diagnosis not present

## 2023-06-21 DIAGNOSIS — D509 Iron deficiency anemia, unspecified: Secondary | ICD-10-CM | POA: Diagnosis not present

## 2023-06-21 DIAGNOSIS — R911 Solitary pulmonary nodule: Secondary | ICD-10-CM | POA: Diagnosis not present

## 2023-06-21 DIAGNOSIS — E1169 Type 2 diabetes mellitus with other specified complication: Secondary | ICD-10-CM | POA: Diagnosis not present

## 2023-06-21 DIAGNOSIS — E7800 Pure hypercholesterolemia, unspecified: Secondary | ICD-10-CM | POA: Diagnosis not present

## 2023-06-21 DIAGNOSIS — I5189 Other ill-defined heart diseases: Secondary | ICD-10-CM | POA: Diagnosis not present

## 2023-06-21 DIAGNOSIS — M85851 Other specified disorders of bone density and structure, right thigh: Secondary | ICD-10-CM | POA: Diagnosis not present

## 2023-06-21 DIAGNOSIS — I5022 Chronic systolic (congestive) heart failure: Secondary | ICD-10-CM | POA: Diagnosis not present

## 2023-06-21 DIAGNOSIS — G2581 Restless legs syndrome: Secondary | ICD-10-CM | POA: Diagnosis not present

## 2023-06-28 ENCOUNTER — Ambulatory Visit
Admission: RE | Admit: 2023-06-28 | Discharge: 2023-06-28 | Disposition: A | Discharge status: Home / Self Care | Payer: 59 | Source: Ambulatory Visit | Attending: Family Medicine | Admitting: Family Medicine

## 2023-06-28 DIAGNOSIS — R911 Solitary pulmonary nodule: Secondary | ICD-10-CM | POA: Insufficient documentation

## 2023-06-28 DIAGNOSIS — I3139 Other pericardial effusion (noninflammatory): Secondary | ICD-10-CM | POA: Diagnosis not present

## 2023-07-11 DIAGNOSIS — I447 Left bundle-branch block, unspecified: Secondary | ICD-10-CM | POA: Diagnosis not present

## 2023-07-11 DIAGNOSIS — I428 Other cardiomyopathies: Secondary | ICD-10-CM | POA: Diagnosis not present

## 2023-07-11 DIAGNOSIS — I10 Essential (primary) hypertension: Secondary | ICD-10-CM | POA: Diagnosis not present

## 2023-07-11 DIAGNOSIS — I5022 Chronic systolic (congestive) heart failure: Secondary | ICD-10-CM | POA: Diagnosis not present

## 2023-07-11 DIAGNOSIS — Z95810 Presence of automatic (implantable) cardiac defibrillator: Secondary | ICD-10-CM | POA: Diagnosis not present

## 2023-07-12 DIAGNOSIS — I110 Hypertensive heart disease with heart failure: Secondary | ICD-10-CM | POA: Diagnosis not present

## 2023-07-12 DIAGNOSIS — I447 Left bundle-branch block, unspecified: Secondary | ICD-10-CM | POA: Diagnosis not present

## 2023-07-12 DIAGNOSIS — I10 Essential (primary) hypertension: Secondary | ICD-10-CM | POA: Diagnosis not present

## 2023-07-12 DIAGNOSIS — I428 Other cardiomyopathies: Secondary | ICD-10-CM | POA: Diagnosis not present

## 2023-07-12 DIAGNOSIS — I5022 Chronic systolic (congestive) heart failure: Secondary | ICD-10-CM | POA: Diagnosis not present

## 2023-07-12 DIAGNOSIS — Z95810 Presence of automatic (implantable) cardiac defibrillator: Secondary | ICD-10-CM | POA: Diagnosis not present

## 2023-07-12 DIAGNOSIS — D509 Iron deficiency anemia, unspecified: Secondary | ICD-10-CM | POA: Diagnosis not present

## 2023-07-12 DIAGNOSIS — K22710 Barrett's esophagus with low grade dysplasia: Secondary | ICD-10-CM | POA: Diagnosis not present

## 2023-07-12 DIAGNOSIS — E119 Type 2 diabetes mellitus without complications: Secondary | ICD-10-CM | POA: Diagnosis not present

## 2023-07-12 DIAGNOSIS — Z01818 Encounter for other preprocedural examination: Secondary | ICD-10-CM | POA: Diagnosis not present

## 2023-07-23 DIAGNOSIS — K2100 Gastro-esophageal reflux disease with esophagitis, without bleeding: Secondary | ICD-10-CM | POA: Diagnosis not present

## 2023-07-23 DIAGNOSIS — I110 Hypertensive heart disease with heart failure: Secondary | ICD-10-CM | POA: Diagnosis not present

## 2023-07-23 DIAGNOSIS — I429 Cardiomyopathy, unspecified: Secondary | ICD-10-CM | POA: Diagnosis not present

## 2023-07-23 DIAGNOSIS — E785 Hyperlipidemia, unspecified: Secondary | ICD-10-CM | POA: Diagnosis not present

## 2023-07-23 DIAGNOSIS — K22711 Barrett's esophagus with high grade dysplasia: Secondary | ICD-10-CM | POA: Diagnosis not present

## 2023-07-23 DIAGNOSIS — K2289 Other specified disease of esophagus: Secondary | ICD-10-CM | POA: Diagnosis not present

## 2023-07-23 DIAGNOSIS — E1142 Type 2 diabetes mellitus with diabetic polyneuropathy: Secondary | ICD-10-CM | POA: Diagnosis not present

## 2023-07-23 DIAGNOSIS — I447 Left bundle-branch block, unspecified: Secondary | ICD-10-CM | POA: Diagnosis not present

## 2023-07-23 DIAGNOSIS — I5022 Chronic systolic (congestive) heart failure: Secondary | ICD-10-CM | POA: Diagnosis not present

## 2023-07-23 DIAGNOSIS — K209 Esophagitis, unspecified without bleeding: Secondary | ICD-10-CM | POA: Diagnosis not present

## 2023-07-23 DIAGNOSIS — I5020 Unspecified systolic (congestive) heart failure: Secondary | ICD-10-CM | POA: Diagnosis not present

## 2023-07-23 DIAGNOSIS — Z87891 Personal history of nicotine dependence: Secondary | ICD-10-CM | POA: Diagnosis not present

## 2023-07-23 DIAGNOSIS — Z7982 Long term (current) use of aspirin: Secondary | ICD-10-CM | POA: Diagnosis not present

## 2023-07-23 DIAGNOSIS — Z79899 Other long term (current) drug therapy: Secondary | ICD-10-CM | POA: Diagnosis not present

## 2023-07-23 DIAGNOSIS — E119 Type 2 diabetes mellitus without complications: Secondary | ICD-10-CM | POA: Diagnosis not present

## 2023-08-05 IMAGING — MG MM DIGITAL SCREENING BILAT W/ TOMO AND CAD
8 series · 8 of 24 positions shown · non-contrast
Comparison: Previous exam(s).

CLINICAL DATA: Screening.

EXAM:
DIGITAL SCREENING BILATERAL MAMMOGRAM WITH TOMOSYNTHESIS AND CAD
TECHNIQUE: Bilateral screening digital craniocaudal and mediolateral oblique
mammograms were obtained. Bilateral screening digital breast
tomosynthesis was performed. The images were evaluated with
computer-aided detection.

[R CC synth-2D]
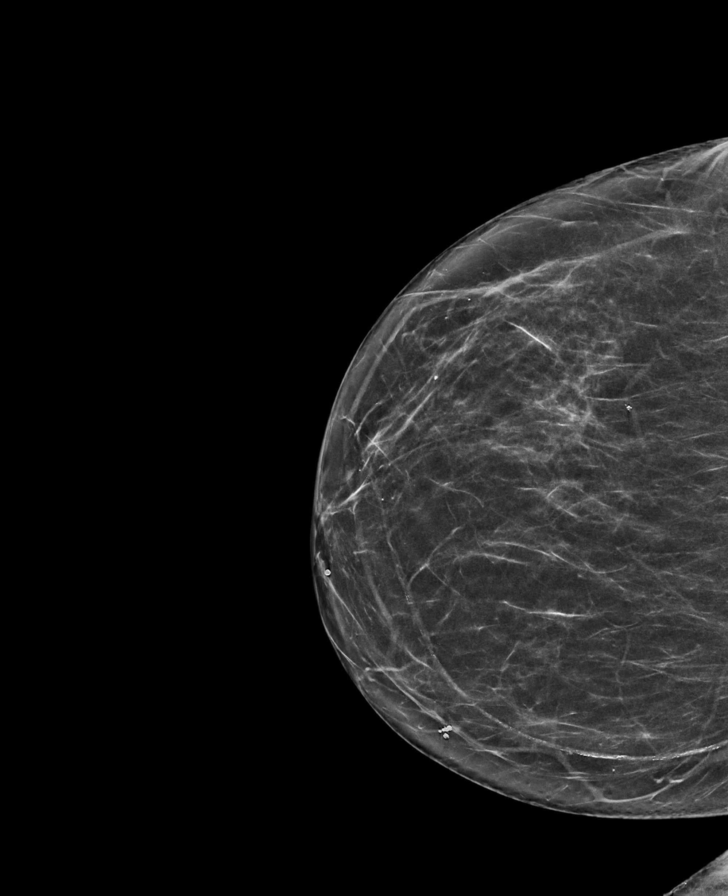

[L MLO synth-2D]
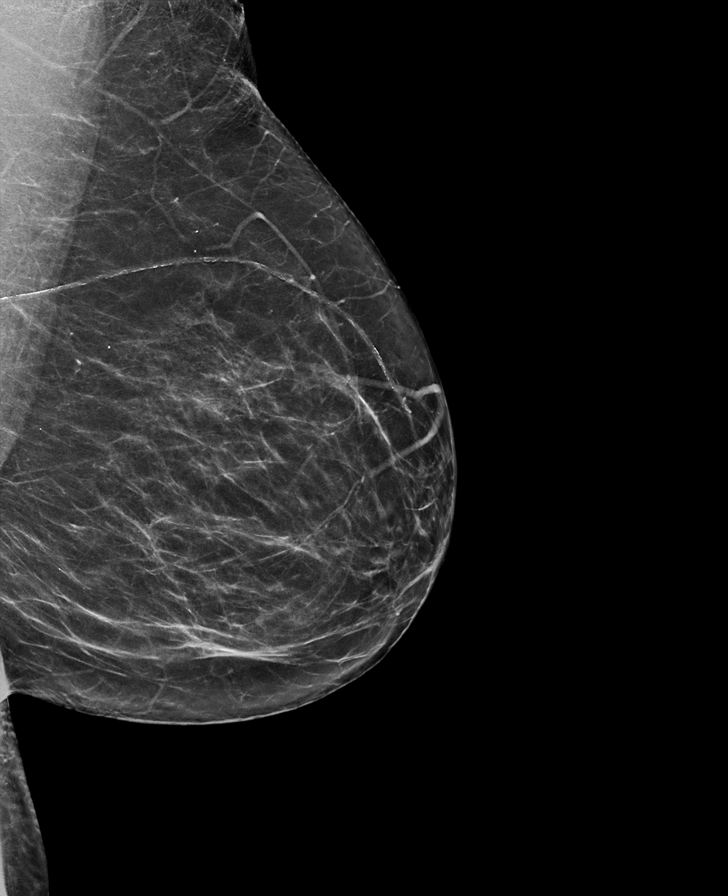

[R MLO synth-2D]
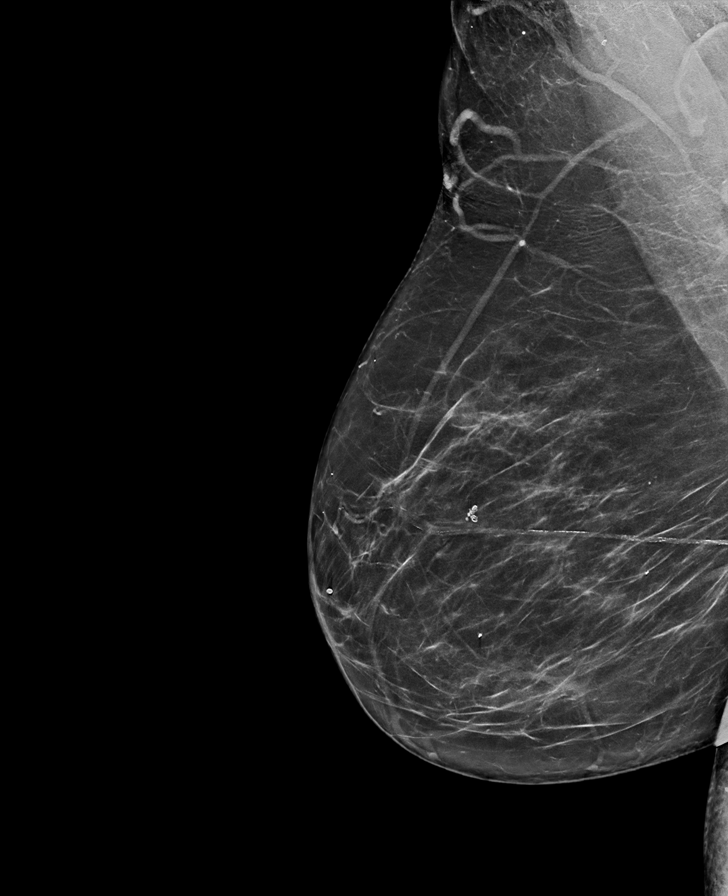

[L CC synth-2D]
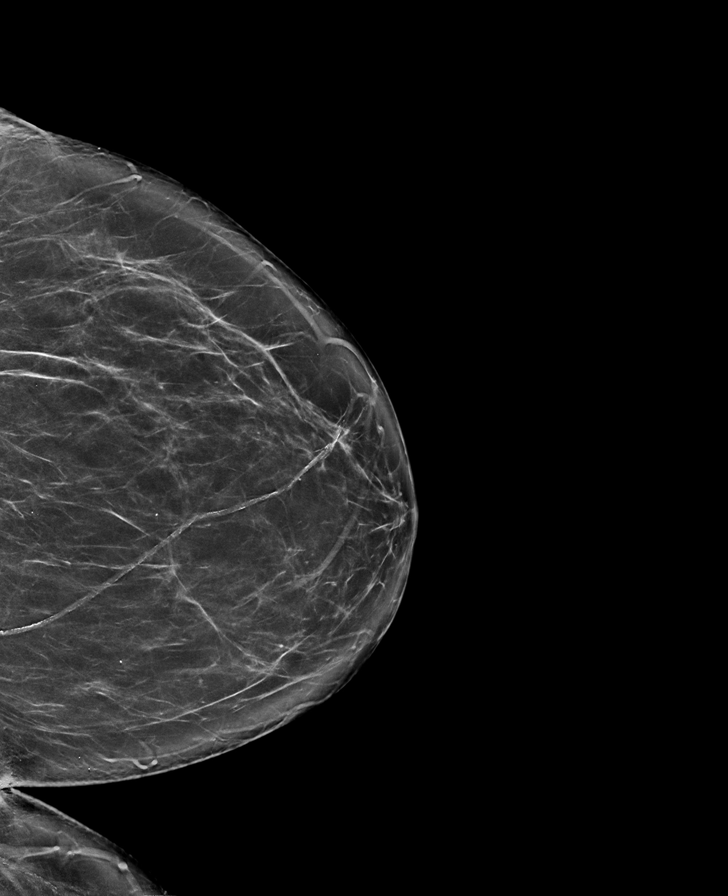

[L MLO tomo · tomo slice 33/65.0]
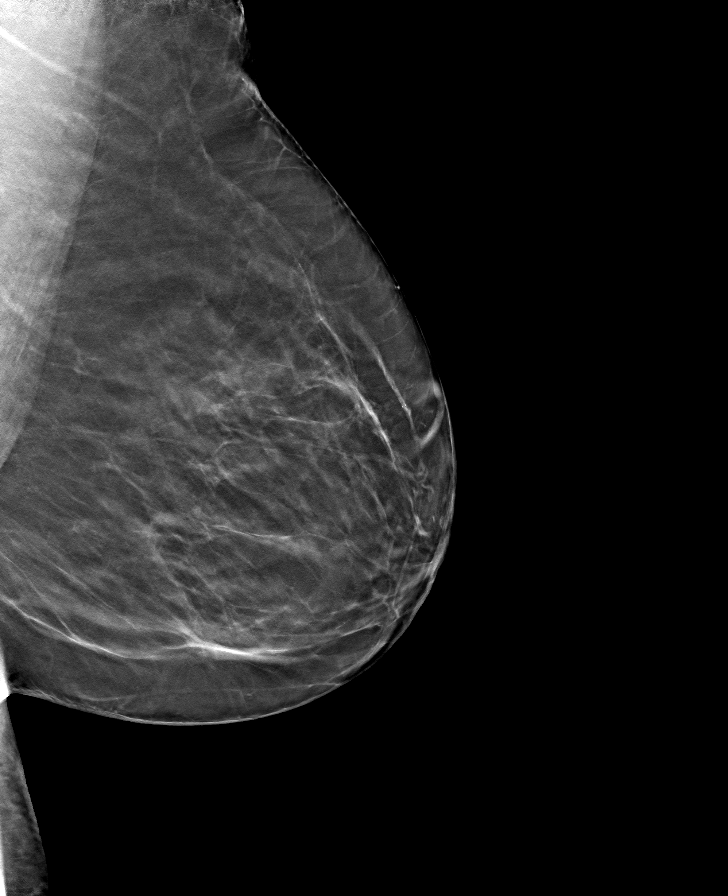

[R MLO tomo · tomo slice 37/73.0]
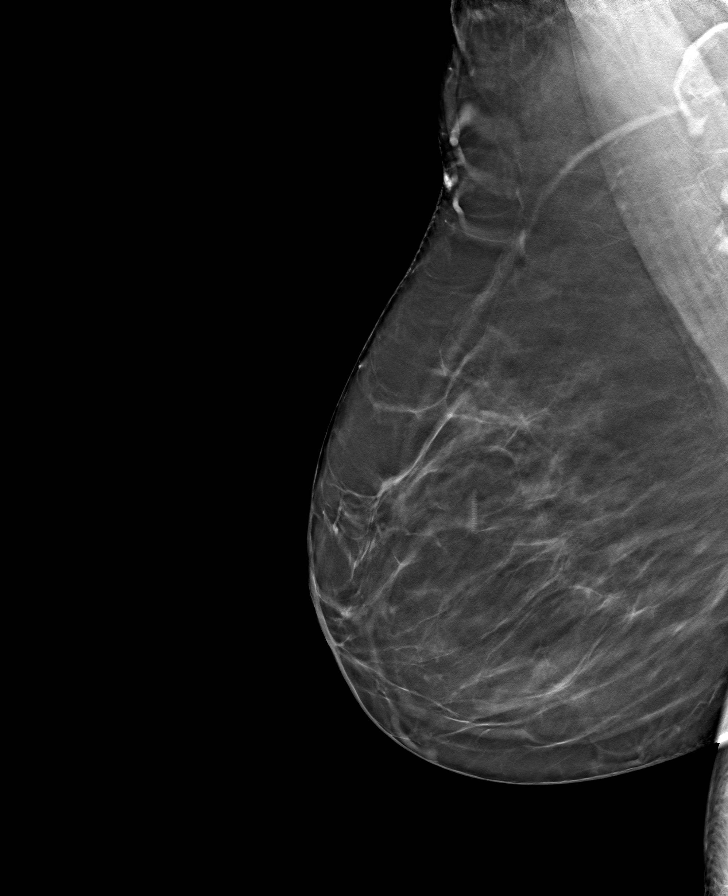

[R CC tomo · tomo slice 31/62.0]
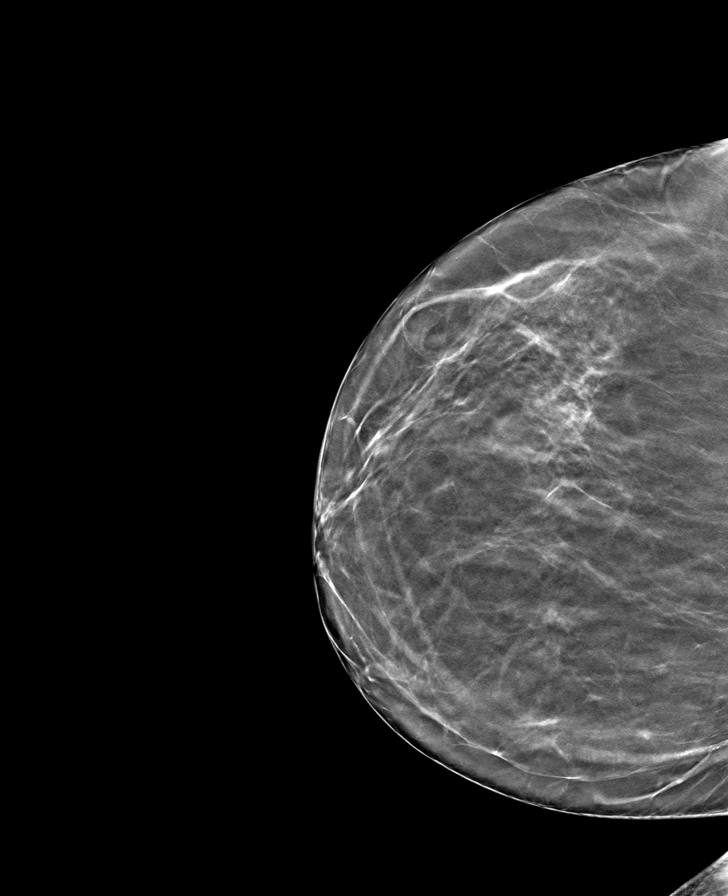

[L CC tomo · tomo slice 41/80.0]
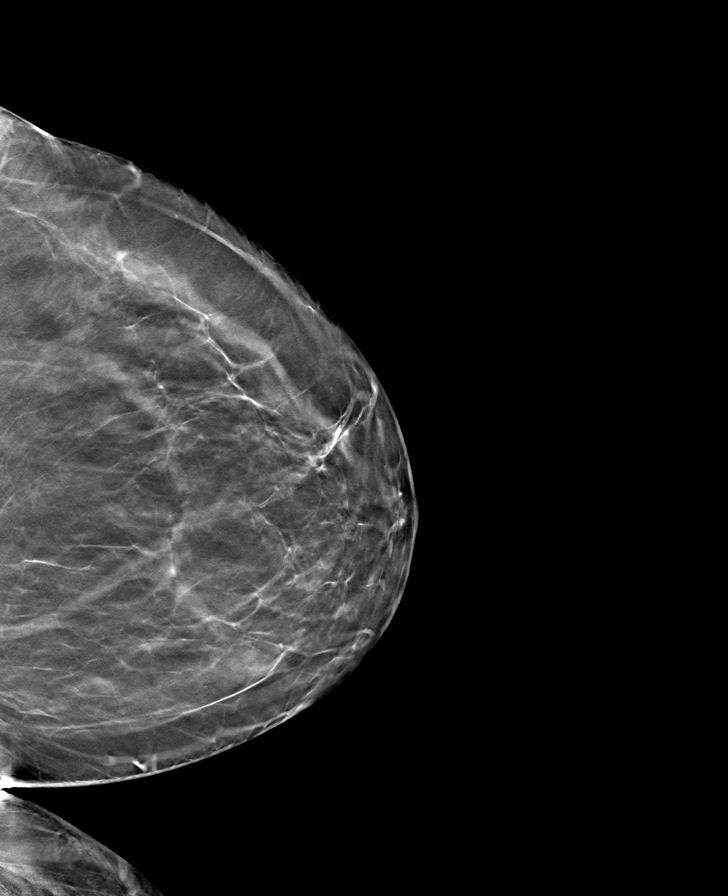

[8 of 24 positions shown; findings below may reference images not displayed]

ACR Breast Density Category b: There are scattered areas of
fibroglandular density.
FINDINGS: There are no findings suspicious for malignancy.
IMPRESSION: No mammographic evidence of malignancy. A result letter of this
screening mammogram will be mailed directly to the patient.

RECOMMENDATION:
Screening mammogram in one year. (Code:51-O-LD2)

BI-RADS CATEGORY  1: Negative.

## 2023-08-21 DIAGNOSIS — I10 Essential (primary) hypertension: Secondary | ICD-10-CM | POA: Diagnosis not present

## 2023-08-21 DIAGNOSIS — Z79899 Other long term (current) drug therapy: Secondary | ICD-10-CM | POA: Diagnosis not present

## 2023-08-21 DIAGNOSIS — I110 Hypertensive heart disease with heart failure: Secondary | ICD-10-CM | POA: Diagnosis not present

## 2023-08-21 DIAGNOSIS — E119 Type 2 diabetes mellitus without complications: Secondary | ICD-10-CM | POA: Diagnosis not present

## 2023-08-21 DIAGNOSIS — I5022 Chronic systolic (congestive) heart failure: Secondary | ICD-10-CM | POA: Diagnosis not present

## 2023-08-21 DIAGNOSIS — I447 Left bundle-branch block, unspecified: Secondary | ICD-10-CM | POA: Diagnosis not present

## 2023-08-21 DIAGNOSIS — Z7984 Long term (current) use of oral hypoglycemic drugs: Secondary | ICD-10-CM | POA: Diagnosis not present

## 2023-08-21 DIAGNOSIS — Z95810 Presence of automatic (implantable) cardiac defibrillator: Secondary | ICD-10-CM | POA: Diagnosis not present

## 2023-08-21 DIAGNOSIS — I428 Other cardiomyopathies: Secondary | ICD-10-CM | POA: Diagnosis not present

## 2023-08-23 DIAGNOSIS — I5032 Chronic diastolic (congestive) heart failure: Secondary | ICD-10-CM | POA: Diagnosis not present

## 2023-08-23 DIAGNOSIS — I447 Left bundle-branch block, unspecified: Secondary | ICD-10-CM | POA: Diagnosis not present

## 2023-08-23 DIAGNOSIS — I428 Other cardiomyopathies: Secondary | ICD-10-CM | POA: Diagnosis not present

## 2023-08-23 DIAGNOSIS — E119 Type 2 diabetes mellitus without complications: Secondary | ICD-10-CM | POA: Diagnosis not present

## 2023-09-28 NOTE — Assessment & Plan Note (Addendum)
 Iron deficiency anemia diagnosed in May 2023 felt to be due to chronic GI losses.  She did not tolerate oral iron supplementation.  She received IV iron in the form of Feraheme  in June 2023.  She underwent EGD and colonoscopy at the end of June and no source of bleeding was found.  She had removal of stomach polyps, which were benign.  She was found to have Barrett esophagus.  She also had removal of colon polyps, which were hyperplastic polyps. Repeat EGD at [facility] in January revealed more advanced appearing lesion. Endoscopic submucosal dissection was scheduled, but not done due to EKG abnormalities.  She underwent endoscopic submucosal resection in May 2024.  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  In August 2024 the Barrett esophagus was treated with RFA.  She continues to follow with GI.  Her most recent EGD in March revealed irregular Z-line. Biopsies revealed squamocolumnar junction mucosa with chronic inflammation. No intestinal metaplasia or dysplasia seen.  She is scheduled for repeat EGD in August.  She is taking a multivitamin with iron without difficulty.  Her hemoglobin remains normal.  Her ferritin is down to 10 with a TIBC of 456 and iron saturation of 11%, so I will schedule her for IV iron again in the upcoming days. I will plan to see her back in 6 months with a CBC, CMP, iron/TIBC and ferritin.

## 2023-09-28 NOTE — Progress Notes (Signed)
 [facility]  [address] [place],  [place]  [address] [phone]  Clinic Day:  10/03/2023  Referring physician: Annette Barters, MD  ASSESSMENT & PLAN:   Assessment & Plan: Iron deficiency anemia Iron deficiency anemia diagnosed in May 2023 felt to be due to chronic GI losses.  She did not tolerate oral iron supplementation.  She received IV iron in the form of Feraheme  in June 2023.  She underwent EGD and colonoscopy at the end of June and no source of bleeding was found.  She had removal of stomach polyps, which were benign.  She was found to have Barrett esophagus.  She also had removal of colon polyps, which were hyperplastic polyps. Repeat EGD at [facility] in January revealed more advanced appearing lesion. Endoscopic submucosal dissection was scheduled, but not done due to EKG abnormalities.  She underwent endoscopic submucosal resection in May 2024.  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  In August 2024 the Barrett esophagus was treated with RFA.  She continues to follow with GI.  Her most recent EGD in March revealed irregular Z-line. Biopsies revealed squamocolumnar junction mucosa with chronic inflammation. No intestinal metaplasia or dysplasia seen.  She is scheduled for repeat EGD in August.  She is taking a multivitamin with iron without difficulty.  Her hemoglobin remains normal.  Her ferritin is down to 10 with a TIBC of 456 and iron saturation of 11%, so I will schedule her for IV iron again in the upcoming days. I will plan to see her back in 6 months with a CBC, CMP, iron/TIBC and ferritin.   The patient understands the plans discussed today and is in agreement with them.  She knows to contact our office if she develops concerns prior to her next appointment.   I provided 20 minutes of face-to-face time during this encounter and > 50% was spent counseling as  documented under my assessment and plan.    Alfonso Ike, PA-C  [name] Dept: [phone] Dept Fax: [phone]   Orders Placed This Encounter  Procedures   CBC with Differential (Cancer Center Only)    Standing Status:   Future    Expected Date:   04/04/2024    Expiration Date:   10/02/2024   CMP (Cancer Center only)    Standing Status:   Future    Expected Date:   04/04/2024    Expiration Date:   10/02/2024   Ferritin    Standing Status:   Future    Expected Date:   04/04/2024    Expiration Date:   10/02/2024   Iron and TIBC    Standing Status:   Future    Expected Date:   04/04/2024    Expiration Date:   10/02/2024      CHIEF COMPLAINT:  CC: Iron deficiency anemia  Current Treatment: Surveillance  HISTORY OF PRESENT ILLNESS:  Jacqueline Alexander is a 73 year old with iron deficiency anemia who I began seeing in June 2023.  The patient had evidence of iron deficiency dating back to June 2022. She does not tolerate oral iron due to constipation, so was referred to us  for IV iron.  She received IV Feraheme  in June with normalization of her hemoglobin and iron stores.  She has not had recurrent anemia.  She  was taking oral B12 and her B12 level was elevated in November, so we had her discontinue B12 supplement.   She has a history of Barrett's esophagus and her last EGD was in March 2021 at [facility] in [place].  This revealed persistent Barrett's disease. She continues pantoprazole 20 mg daily.  Her last colonoscopy was in January 2018 at [facility].  This was normal, so follow-up in 10 years was recommended.  The patient has never had gastric surgery.  She underwent total hysterectomy and bilateral salpingo-oophorectomy with bladder tacking at age 18 for bladder and uterine prolapse and denies vaginal bleeding. She is on aspirin 81 mg daily.  She  denied heavy alcohol use, but does drink socially.     She was referred to [facility] for follow-up of her Barrett's disease and was seen in January.  EGD revealed more advanced changes of Barrett's, so endoscopic submucosal dissection was scheduled.  This procedure was not done due to findings of a left bundle branch block preop EKG.  She saw her cardiologist and had an echocardiogram. She underwent endoscopic submucosal resection in May.  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  In August 2024, she had RFA for her Barrett's disease.   INTERVAL HISTORY:  Jacqueline Alexander is here today for repeat clinical assessment.  She denies progressive fatigue concerning for recurrent anemia.  She denies fevers or chills. She denies pain. Her appetite is good. Her weight has been stable.  She states she continues a multivitamin with iron.  Since her last visit, she underwent EGD in March.  She is scheduled for EGD again in August.  Dr. Godwin Lat ordered a repeat CT chest in February to follow-up on a pulmonary nodule.  This was unchanged.  Annual CT chest until 5 year stability was recommended.  She states she is up-to-date on screening mammogram.  REVIEW OF SYSTEMS:  Review of Systems  Constitutional:  Negative for appetite change, chills, fatigue, fever and unexpected weight change.  HENT:   Negative for lump/mass, mouth sores and sore throat.   Respiratory:  Negative for cough and shortness of breath.   Cardiovascular:  Negative for chest pain and leg swelling.  Gastrointestinal:  Negative for abdominal pain, constipation, diarrhea, nausea and vomiting.  Endocrine: Negative for hot flashes.  Genitourinary:  Negative for difficulty urinating, dysuria, frequency and hematuria.   Musculoskeletal:  Negative for arthralgias, back pain and myalgias.  Skin:  Negative for rash.  Neurological:  Positive for numbness (intermittent numbness  and tingling of feet). Negative for dizziness and headaches.  Hematological:  Negative for adenopathy. Does not bruise/bleed easily.  Psychiatric/Behavioral:  Negative for depression and sleep disturbance. The patient is not nervous/anxious.      VITALS:  Blood pressure (!) 100/58, pulse 92, temperature 98.5 F (36.9 C), temperature source Oral, resp. rate 18, height 5' 3.7" (1.618 m), weight 153 lb 6.4 oz (69.6 kg), SpO2 96%. Wt Readings from Last 3 Encounters:  10/03/23 153 lb 6.4 oz (69.6 kg)  04/05/23 153 lb 3.2 oz (69.5 kg)  12/26/22 149 lb 11.2 oz (67.9 kg)  Body mass index is 26.58 kg/m.  Performance status (ECOG): 0 - Asymptomatic  PHYSICAL EXAM:  Physical Exam Vitals and nursing note reviewed.  Constitutional:      General: She is not in acute distress.    Appearance: Normal appearance.  HENT:     Head: Normocephalic and atraumatic.  Mouth/Throat:     Mouth: Mucous membranes are moist.     Pharynx: Oropharynx is clear. No oropharyngeal exudate or posterior oropharyngeal erythema.  Eyes:     General: No scleral icterus.    Extraocular Movements: Extraocular movements intact.     Conjunctiva/sclera: Conjunctivae normal.     Pupils: Pupils are equal, round, and reactive to light.  Cardiovascular:     Rate and Rhythm: Normal rate and regular rhythm.     Heart sounds: Normal heart sounds. No murmur heard.    No friction rub. No gallop.  Pulmonary:     Effort: Pulmonary effort is normal.     Breath sounds: Normal breath sounds. No wheezing, rhonchi or rales.  Abdominal:     General: There is no distension.     Palpations: Abdomen is soft. There is no hepatomegaly, splenomegaly or mass.     Tenderness: There is no abdominal tenderness.  Musculoskeletal:        General: Normal range of motion.     Cervical back: Normal range of motion and neck supple. No tenderness.     Right lower leg: No edema.     Left lower leg: No edema.  Lymphadenopathy:     Cervical: No  cervical adenopathy.     Upper Body:     Right upper body: No supraclavicular or axillary adenopathy.     Left upper body: No supraclavicular or axillary adenopathy.     Lower Body: No right inguinal adenopathy. No left inguinal adenopathy.  Skin:    General: Skin is warm and dry.     Coloration: Skin is not jaundiced.     Findings: No rash.  Neurological:     Mental Status: She is alert and oriented to person, place, and time.     Cranial Nerves: No cranial nerve deficit.  Psychiatric:        Mood and Affect: Mood normal.        Behavior: Behavior normal.        Thought Content: Thought content normal.     LABS:      Latest Ref Rng & Units 10/03/2023   10:50 AM 04/05/2023   10:43 AM 12/26/2022   10:44 AM  CBC  WBC 4.0 - 10.5 K/uL 9.5  10.5  10.5   Hemoglobin 12.0 - 15.0 g/dL 78.2  95.6  21.3   Hematocrit 36.0 - 46.0 % 41.9  41.1  41.3   Platelets 150 - 400 K/uL 271  289  351       Latest Ref Rng & Units 10/03/2023   10:50 AM 04/05/2023   10:43 AM 12/26/2022   10:44 AM  CMP  Glucose 70 - 99 mg/dL 086  578  469   BUN 8 - 23 mg/dL 22  20  13    Creatinine 0.44 - 1.00 mg/dL 6.29  5.28  4.13   Sodium 135 - 145 mmol/L 140  138  137   Potassium 3.5 - 5.1 mmol/L 4.3  4.6  4.2   Chloride 98 - 111 mmol/L 104  103  105   CO2 22 - 32 mmol/L 22  23  25    Calcium 8.9 - 10.3 mg/dL 24.4  01.0  9.7   Total Protein 6.5 - 8.1 g/dL 7.0  7.1  7.4   Total Bilirubin 0.0 - 1.2 mg/dL 0.3  0.3  0.4   Alkaline Phos 38 - 126 U/L 80  67  62   AST 15 - 41 U/L 13  15  18   ALT 0 - 44 U/L 8  8  14     Lab Results  Component Value Date   TIBC 456 (H) 10/03/2023   TIBC 428 04/05/2023   TIBC 381 12/26/2022   FERRITIN 10 (L) 10/03/2023   FERRITIN 15 04/05/2023   FERRITIN 27 12/26/2022   IRONPCTSAT 11 10/03/2023   IRONPCTSAT 16 04/05/2023   IRONPCTSAT 22 12/26/2022    STUDIES:  No results found.    HISTORY:   Past Medical History:  Diagnosis Date   Anxiety    Arthritis    Barrett  esophagus    Chronic diarrhea    Colon polyps    Depression    Diabetes mellitus without complication (HCC)    Family history of ovarian cancer 12/11/2021   Family history of stomach cancer 12/11/2021   GERD (gastroesophageal reflux disease)    Hyperlipemia    Hypertension    Insomnia    Lumbar radiculitis    Mitral insufficiency    Restless leg    Restless leg syndrome    Sensory urge incontinence     Past Surgical History:  Procedure Laterality Date   CARPAL TUNNEL RELEASE Left    CHOLECYSTECTOMY     COLONOSCOPY N/A 11/10/2021   Procedure: COLONOSCOPY;  Surgeon: Quintin Buckle, DO;  Location: [facility] ENDOSCOPY;  Service: Gastroenterology;  Laterality: N/A;   defibulator     August 2024 at [facility]   esophageal ablation  12/15/2022   for Barretts disease   ESOPHAGOGASTRODUODENOSCOPY N/A 11/10/2021   Procedure: ESOPHAGOGASTRODUODENOSCOPY (EGD);  Surgeon: Quintin Buckle, DO;  Location: [facility] ENDOSCOPY;  Service: Gastroenterology;  Laterality: N/A;  DM   ESOPHAGOGASTRODUODENOSCOPY (EGD) WITH PROPOFOL  N/A 08/06/2019   Procedure: ESOPHAGOGASTRODUODENOSCOPY (EGD) WITH PROPOFOL ;  Surgeon: Toledo, Alphonsus Jeans, MD;  Location: [facility] ENDOSCOPY;  Service: Gastroenterology;  Laterality: N/A;   HERNIA REPAIR     INCONTINENCE SURGERY     sling   OOPHORECTOMY     TONSILLECTOMY     VAGINAL HYSTERECTOMY     VAGINAL PROLAPSE REPAIR      Family History  Problem Relation Age of Onset   Hypertension Mother    Arthritis Mother    Heart disease Mother    Skin cancer Mother        dx after 53   Hypertension Father    Tuberculosis Father    Emphysema Father    Heart attack Father    Hyperlipidemia Father    Hypertension Sister    Skin cancer Sister        dx after 49   Stomach cancer Maternal Uncle        dx late 82s   Lung cancer Paternal Aunt    Ovarian cancer Paternal Aunt        dx after 67   Ovarian cancer Paternal Grandmother        d. late 39s   Pancreatitis Niece         d. 74   Breast cancer Neg Hx     Social History:  reports that she quit smoking about 13 years ago. Her smoking use included cigarettes. She started smoking about 53 years ago. She has a 40 pack-year smoking history. She has never used smokeless tobacco. She reports current alcohol use. She reports that she does not currently use drugs.The patient is alone today.  Allergies:  Allergies  Allergen Reactions   Morphine Nausea And Vomiting, Nausea Only and Other (See Comments)   Ace Inhibitors  Other (See Comments)    Patient thinks she was light headed . She is not sure of the reaction   Ezetimibe Other (See Comments)    Leg pain    Current Medications: Current Outpatient Medications  Medication Sig Dispense Refill   Blood Glucose Monitoring Suppl (ONETOUCH VERIO FLEX SYSTEM) w/Device KIT See admin instructions.     Clobetasol  Prop Emollient Base 0.05 % emollient cream Apply topically as needed.     furosemide (LASIX) 20 MG tablet Take 20 mg by mouth as needed.     Lancets (ONETOUCH DELICA PLUS LANCET33G) MISC Apply topically.     OZEMPIC, 0.25 OR 0.5 MG/DOSE, 2 MG/3ML SOPN Inject 0.5 Doses as directed once a week.     spironolactone (ALDACTONE) 25 MG tablet Take 1 tablet by mouth daily.     albuterol  (VENTOLIN  HFA) 108 (90 Base) MCG/ACT inhaler SMARTSIG:2 Puff(s) By Mouth Every 4-6 Hours PRN     aspirin EC 81 MG tablet Take 81 mg by mouth daily. Swallow whole.     BIOTIN PO Take 1 tablet by mouth daily.     candesartan (ATACAND) 4 MG tablet Take by mouth.     carboxymethylcellulose (REFRESH PLUS) 0.5 % SOLN Place 1-2 drops into both eyes daily as needed.     carvedilol (COREG) 25 MG tablet Take by mouth.     cholecalciferol (VITAMIN D3) 10 MCG (400 UNIT) TABS tablet Take 1,000 Units by mouth.     Cysteamine Bitartrate (PROCYSBI) 300 MG PACK lancets 30 gauge     estradiol  (ESTRACE ) 0.1 MG/GM vaginal cream Place 0.5g nightly twice a week 30 g 11   FLUoxetine (PROZAC) 20 MG capsule Take  20 mg by mouth daily. 3 tablets for total dose of 60 mg a day     fluticasone (FLONASE) 50 MCG/ACT nasal spray Place 1 spray into both nostrils daily.     glucose blood test strip TEST SUGAR THREE TIMES DAILY     JARDIANCE 10 MG TABS tablet Take 10 mg by mouth daily.     loperamide (IMODIUM A-D) 2 MG tablet Take 2 mg by mouth as needed.     loratadine (CLARITIN) 10 MG tablet Take 10 mg by mouth daily.     LORazepam (ATIVAN) 0.5 MG tablet Take 0.5 mg by mouth as needed.  0   metFORMIN (GLUCOPHAGE-XR) 750 MG 24 hr tablet Take 750 mg by mouth 2 (two) times daily.     Multiple Vitamins-Minerals (MULTIPLE VITAMINS/WOMENS PO) Take 1 tablet by mouth daily.     ONETOUCH VERIO test strip daily.     pantoprazole (PROTONIX) 40 MG tablet 40 mg 2 (two) times daily.     Probiotic Product (PROBIOTIC BLEND PO) Take 1 tablet by mouth daily.     rosuvastatin (CRESTOR) 5 MG tablet Take 5 mg by mouth daily.     vitamin B-12 (CYANOCOBALAMIN ) 250 MCG tablet Take 2,500 mcg by mouth daily.     No current facility-administered medications for this visit.

## 2023-10-03 ENCOUNTER — Other Ambulatory Visit: Payer: Self-pay | Admitting: Hematology and Oncology

## 2023-10-03 ENCOUNTER — Telehealth: Payer: Self-pay | Admitting: Hematology and Oncology

## 2023-10-03 ENCOUNTER — Inpatient Hospital Stay: Payer: 59

## 2023-10-03 ENCOUNTER — Inpatient Hospital Stay: Payer: 59 | Attending: Hematology and Oncology | Admitting: Hematology and Oncology

## 2023-10-03 ENCOUNTER — Encounter: Payer: Self-pay | Admitting: Hematology and Oncology

## 2023-10-03 VITALS — BP 100/58 | HR 92 | Temp 98.5°F | Resp 18 | Ht 63.7 in | Wt 153.4 lb

## 2023-10-03 DIAGNOSIS — D509 Iron deficiency anemia, unspecified: Secondary | ICD-10-CM | POA: Diagnosis not present

## 2023-10-03 DIAGNOSIS — Z79899 Other long term (current) drug therapy: Secondary | ICD-10-CM | POA: Diagnosis not present

## 2023-10-03 LAB — CBC WITH DIFFERENTIAL (CANCER CENTER ONLY)
Abs Immature Granulocytes: 0.03 10*3/uL (ref 0.00–0.07)
Basophils Absolute: 0.1 10*3/uL (ref 0.0–0.1)
Basophils Relative: 1 %
Eosinophils Absolute: 0.4 10*3/uL (ref 0.0–0.5)
Eosinophils Relative: 4 %
HCT: 41.9 % (ref 36.0–46.0)
Hemoglobin: 13.6 g/dL (ref 12.0–15.0)
Immature Granulocytes: 0 %
Lymphocytes Relative: 22 %
Lymphs Abs: 2.1 10*3/uL (ref 0.7–4.0)
MCH: 28.9 pg (ref 26.0–34.0)
MCHC: 32.5 g/dL (ref 30.0–36.0)
MCV: 89.1 fL (ref 80.0–100.0)
Monocytes Absolute: 0.6 10*3/uL (ref 0.1–1.0)
Monocytes Relative: 7 %
Neutro Abs: 6.2 10*3/uL (ref 1.7–7.7)
Neutrophils Relative %: 66 %
Platelet Count: 271 10*3/uL (ref 150–400)
RBC: 4.7 MIL/uL (ref 3.87–5.11)
RDW: 12.9 % (ref 11.5–15.5)
WBC Count: 9.5 10*3/uL (ref 4.0–10.5)
nRBC: 0 % (ref 0.0–0.2)

## 2023-10-03 LAB — FERRITIN: Ferritin: 10 ng/mL — ABNORMAL LOW (ref 11–307)

## 2023-10-03 LAB — CMP (CANCER CENTER ONLY)
ALT: 8 U/L (ref 0–44)
AST: 13 U/L — ABNORMAL LOW (ref 15–41)
Albumin: 4.2 g/dL (ref 3.5–5.0)
Alkaline Phosphatase: 80 U/L (ref 38–126)
Anion gap: 14 (ref 5–15)
BUN: 22 mg/dL (ref 8–23)
CO2: 22 mmol/L (ref 22–32)
Calcium: 10.4 mg/dL — ABNORMAL HIGH (ref 8.9–10.3)
Chloride: 104 mmol/L (ref 98–111)
Creatinine: 0.99 mg/dL (ref 0.44–1.00)
GFR, Estimated: >60 mL/min (ref >60)
Glucose, Bld: 158 mg/dL — ABNORMAL HIGH (ref 70–99)
Potassium: 4.3 mmol/L (ref 3.5–5.1)
Sodium: 140 mmol/L (ref 135–145)
Total Bilirubin: 0.3 mg/dL (ref 0.0–1.2)
Total Protein: 7 g/dL (ref 6.5–8.1)

## 2023-10-03 LAB — IRON AND TIBC
Iron: 48 ug/dL (ref 28–170)
Saturation Ratios: 11 % (ref 10.4–31.8)
TIBC: 456 ug/dL — ABNORMAL HIGH (ref 250–450)
UIBC: 408 ug/dL

## 2023-10-03 NOTE — Telephone Encounter (Signed)
 Patient has been scheduled for follow-up visit per 10/03/23 LOS.  Pt given an appt calendar with date and time.

## 2023-10-04 ENCOUNTER — Telehealth: Payer: Self-pay | Admitting: Hematology and Oncology

## 2023-10-04 NOTE — Telephone Encounter (Signed)
 Patient has been scheduled. Aware of appt dates and times.   Scheduling Message Entered by Vera Gip A on 10/03/2023 at  3:22 PM Priority: High <No visit type provided>  Department: [facility]  Provider:  Scheduling Notes:  Please schedule Feraheme  x 2 next available. I put in for a week from today, but just when they have openings is good. Thanks

## 2023-10-10 ENCOUNTER — Inpatient Hospital Stay

## 2023-10-10 VITALS — BP 112/67 | HR 77 | Temp 98.0°F | Resp 18

## 2023-10-10 DIAGNOSIS — I5022 Chronic systolic (congestive) heart failure: Secondary | ICD-10-CM | POA: Diagnosis not present

## 2023-10-10 DIAGNOSIS — Z95810 Presence of automatic (implantable) cardiac defibrillator: Secondary | ICD-10-CM | POA: Diagnosis not present

## 2023-10-10 DIAGNOSIS — I447 Left bundle-branch block, unspecified: Secondary | ICD-10-CM | POA: Diagnosis not present

## 2023-10-10 DIAGNOSIS — D509 Iron deficiency anemia, unspecified: Secondary | ICD-10-CM

## 2023-10-10 DIAGNOSIS — I428 Other cardiomyopathies: Secondary | ICD-10-CM | POA: Diagnosis not present

## 2023-10-10 DIAGNOSIS — Z79899 Other long term (current) drug therapy: Secondary | ICD-10-CM | POA: Diagnosis not present

## 2023-10-10 DIAGNOSIS — I10 Essential (primary) hypertension: Secondary | ICD-10-CM | POA: Diagnosis not present

## 2023-10-10 DIAGNOSIS — I5032 Chronic diastolic (congestive) heart failure: Secondary | ICD-10-CM | POA: Diagnosis not present

## 2023-10-10 MED ORDER — LORATADINE 10 MG PO TABS
10.0000 mg | ORAL_TABLET | Freq: Once | ORAL | Status: AC
Start: 2023-10-10 — End: 2023-10-10
  Administered 2023-10-10: 10 mg via ORAL
  Filled 2023-10-10: qty 1

## 2023-10-10 MED ORDER — SODIUM CHLORIDE 0.9 % IV SOLN
INTRAVENOUS | Status: DC
Start: 2023-10-10 — End: 2023-10-10

## 2023-10-10 MED ORDER — FERUMOXYTOL INJECTION 510 MG/17 ML
510.0000 mg | Freq: Once | INTRAVENOUS | Status: AC
  Administered 2023-10-10: 510 mg via INTRAVENOUS
  Filled 2023-10-10: qty 510

## 2023-10-10 MED ORDER — ACETAMINOPHEN 325 MG PO TABS
650.0000 mg | ORAL_TABLET | Freq: Once | ORAL | Status: AC
Start: 2023-10-10 — End: 2023-10-10
  Administered 2023-10-10: 650 mg via ORAL
  Filled 2023-10-10: qty 2

## 2023-10-10 NOTE — Patient Instructions (Signed)

## 2023-10-17 ENCOUNTER — Inpatient Hospital Stay: Attending: Hematology and Oncology

## 2023-10-17 VITALS — BP 116/67 | HR 71 | Temp 98.0°F | Resp 18

## 2023-10-17 DIAGNOSIS — D509 Iron deficiency anemia, unspecified: Secondary | ICD-10-CM | POA: Diagnosis not present

## 2023-10-17 MED ORDER — LORATADINE 10 MG PO TABS
10.0000 mg | ORAL_TABLET | Freq: Once | ORAL | Status: AC
  Administered 2023-10-17: 10 mg via ORAL
  Filled 2023-10-17: qty 1

## 2023-10-17 MED ORDER — SODIUM CHLORIDE 0.9 % IV SOLN
510.0000 mg | Freq: Once | INTRAVENOUS | Status: AC
  Administered 2023-10-17: 510 mg via INTRAVENOUS
  Filled 2023-10-17: qty 510

## 2023-10-17 MED ORDER — SODIUM CHLORIDE 0.9 % IV SOLN: INTRAVENOUS | Status: DC

## 2023-10-17 MED ORDER — ACETAMINOPHEN 325 MG PO TABS
650.0000 mg | ORAL_TABLET | Freq: Once | ORAL | Status: AC
  Administered 2023-10-17: 650 mg via ORAL
  Filled 2023-10-17: qty 2

## 2023-10-17 NOTE — Patient Instructions (Signed)

## 2023-10-23 DIAGNOSIS — Z23 Encounter for immunization: Secondary | ICD-10-CM | POA: Diagnosis not present

## 2023-10-23 DIAGNOSIS — E1169 Type 2 diabetes mellitus with other specified complication: Secondary | ICD-10-CM | POA: Diagnosis not present

## 2023-10-23 DIAGNOSIS — I10 Essential (primary) hypertension: Secondary | ICD-10-CM | POA: Diagnosis not present

## 2023-10-23 DIAGNOSIS — E538 Deficiency of other specified B group vitamins: Secondary | ICD-10-CM | POA: Diagnosis not present

## 2023-10-23 DIAGNOSIS — I5022 Chronic systolic (congestive) heart failure: Secondary | ICD-10-CM | POA: Diagnosis not present

## 2023-10-23 DIAGNOSIS — D509 Iron deficiency anemia, unspecified: Secondary | ICD-10-CM | POA: Diagnosis not present

## 2023-10-23 DIAGNOSIS — G2581 Restless legs syndrome: Secondary | ICD-10-CM | POA: Diagnosis not present

## 2023-10-23 DIAGNOSIS — E7800 Pure hypercholesterolemia, unspecified: Secondary | ICD-10-CM | POA: Diagnosis not present

## 2023-10-23 DIAGNOSIS — M85851 Other specified disorders of bone density and structure, right thigh: Secondary | ICD-10-CM | POA: Diagnosis not present

## 2023-10-23 DIAGNOSIS — I5189 Other ill-defined heart diseases: Secondary | ICD-10-CM | POA: Diagnosis not present

## 2023-11-01 DIAGNOSIS — H11221 Conjunctival granuloma, right eye: Secondary | ICD-10-CM | POA: Diagnosis not present

## 2023-12-07 DIAGNOSIS — Z95810 Presence of automatic (implantable) cardiac defibrillator: Secondary | ICD-10-CM | POA: Diagnosis not present

## 2023-12-07 DIAGNOSIS — E119 Type 2 diabetes mellitus without complications: Secondary | ICD-10-CM | POA: Diagnosis not present

## 2023-12-07 DIAGNOSIS — I5032 Chronic diastolic (congestive) heart failure: Secondary | ICD-10-CM | POA: Diagnosis not present

## 2023-12-07 DIAGNOSIS — K22711 Barrett's esophagus with high grade dysplasia: Secondary | ICD-10-CM | POA: Diagnosis not present

## 2023-12-07 DIAGNOSIS — I428 Other cardiomyopathies: Secondary | ICD-10-CM | POA: Diagnosis not present

## 2023-12-07 DIAGNOSIS — K219 Gastro-esophageal reflux disease without esophagitis: Secondary | ICD-10-CM | POA: Diagnosis not present

## 2023-12-28 DIAGNOSIS — I5032 Chronic diastolic (congestive) heart failure: Secondary | ICD-10-CM | POA: Diagnosis not present

## 2023-12-28 DIAGNOSIS — Z79899 Other long term (current) drug therapy: Secondary | ICD-10-CM | POA: Diagnosis not present

## 2023-12-28 DIAGNOSIS — E785 Hyperlipidemia, unspecified: Secondary | ICD-10-CM | POA: Diagnosis not present

## 2023-12-28 DIAGNOSIS — E119 Type 2 diabetes mellitus without complications: Secondary | ICD-10-CM | POA: Diagnosis not present

## 2023-12-28 DIAGNOSIS — I5030 Unspecified diastolic (congestive) heart failure: Secondary | ICD-10-CM | POA: Diagnosis not present

## 2023-12-28 DIAGNOSIS — Z8719 Personal history of other diseases of the digestive system: Secondary | ICD-10-CM | POA: Diagnosis not present

## 2023-12-28 DIAGNOSIS — K2289 Other specified disease of esophagus: Secondary | ICD-10-CM | POA: Diagnosis not present

## 2023-12-28 DIAGNOSIS — Z9049 Acquired absence of other specified parts of digestive tract: Secondary | ICD-10-CM | POA: Diagnosis not present

## 2023-12-28 DIAGNOSIS — K219 Gastro-esophageal reflux disease without esophagitis: Secondary | ICD-10-CM | POA: Diagnosis not present

## 2023-12-28 DIAGNOSIS — I428 Other cardiomyopathies: Secondary | ICD-10-CM | POA: Diagnosis not present

## 2023-12-28 DIAGNOSIS — Z87891 Personal history of nicotine dependence: Secondary | ICD-10-CM | POA: Diagnosis not present

## 2023-12-28 DIAGNOSIS — K2270 Barrett's esophagus without dysplasia: Secondary | ICD-10-CM | POA: Diagnosis not present

## 2023-12-28 DIAGNOSIS — Z7984 Long term (current) use of oral hypoglycemic drugs: Secondary | ICD-10-CM | POA: Diagnosis not present

## 2023-12-28 DIAGNOSIS — K22711 Barrett's esophagus with high grade dysplasia: Secondary | ICD-10-CM | POA: Diagnosis not present

## 2023-12-28 DIAGNOSIS — I110 Hypertensive heart disease with heart failure: Secondary | ICD-10-CM | POA: Diagnosis not present

## 2024-01-07 ENCOUNTER — Other Ambulatory Visit: Payer: Self-pay | Admitting: Family Medicine

## 2024-01-07 DIAGNOSIS — Z1231 Encounter for screening mammogram for malignant neoplasm of breast: Secondary | ICD-10-CM

## 2024-01-09 DIAGNOSIS — I10 Essential (primary) hypertension: Secondary | ICD-10-CM | POA: Diagnosis not present

## 2024-01-09 DIAGNOSIS — I447 Left bundle-branch block, unspecified: Secondary | ICD-10-CM | POA: Diagnosis not present

## 2024-01-09 DIAGNOSIS — I5022 Chronic systolic (congestive) heart failure: Secondary | ICD-10-CM | POA: Diagnosis not present

## 2024-01-09 DIAGNOSIS — Z95810 Presence of automatic (implantable) cardiac defibrillator: Secondary | ICD-10-CM | POA: Diagnosis not present

## 2024-01-09 DIAGNOSIS — I428 Other cardiomyopathies: Secondary | ICD-10-CM | POA: Diagnosis not present

## 2024-01-09 DIAGNOSIS — I5032 Chronic diastolic (congestive) heart failure: Secondary | ICD-10-CM | POA: Diagnosis not present

## 2024-01-21 ENCOUNTER — Ambulatory Visit: Admitting: Obstetrics and Gynecology

## 2024-01-21 ENCOUNTER — Encounter: Payer: Self-pay | Admitting: Obstetrics and Gynecology

## 2024-01-21 VITALS — BP 94/65 | HR 86

## 2024-01-21 DIAGNOSIS — N952 Postmenopausal atrophic vaginitis: Secondary | ICD-10-CM

## 2024-01-21 DIAGNOSIS — R159 Full incontinence of feces: Secondary | ICD-10-CM

## 2024-01-21 DIAGNOSIS — N898 Other specified noninflammatory disorders of vagina: Secondary | ICD-10-CM

## 2024-01-21 MED ORDER — ESTRADIOL 0.1 MG/GM VA CREA: TOPICAL_CREAM | VAGINAL | 11 refills | Status: AC

## 2024-01-21 MED ORDER — FLUCONAZOLE 150 MG PO TABS: 150.0000 mg | ORAL_TABLET | Freq: Once | ORAL | 0 refills | Status: AC

## 2024-01-21 MED ORDER — CLOBETASOL PROP EMOLLIENT BASE 0.05 % EX CREA: 1.0000 | TOPICAL_CREAM | CUTANEOUS | 5 refills | Status: AC | PRN

## 2024-01-21 NOTE — Progress Notes (Signed)
 [facility] Return Visit  SUBJECTIVE  History of Present Illness: Jacqueline Alexander is a 73 y.o. female seen in follow-up for FI, lichens sclerosus, and vaginal atrophy. Plan at last visit was continue clobetasol  PRN and use estrogen x2 weekly.     Past Medical History: Patient  has a past medical history of Anxiety, Arthritis, Barrett esophagus, Chronic diarrhea, Colon polyps, Depression, Diabetes mellitus without complication (HCC), Family history of ovarian cancer (12/11/2021), Family history of stomach cancer (12/11/2021), GERD (gastroesophageal reflux disease), Hyperlipemia, Hypertension, Insomnia, Lumbar radiculitis, Mitral insufficiency, Restless leg, Restless leg syndrome, and Sensory urge incontinence.   Past Surgical History: She  has a past surgical history that includes Oophorectomy; Cholecystectomy; Hernia repair; Carpal tunnel release (Left); Esophagogastroduodenoscopy (egd) with propofol  (N/A, 08/06/2019); Tonsillectomy; Vaginal hysterectomy; Incontinence surgery; Esophagogastroduodenoscopy (N/A, 11/10/2021); Colonoscopy (N/A, 11/10/2021); Vaginal prolapse repair; esophageal ablation (12/15/2022); and defibulator.   Medications: She has a current medication list which includes the following prescription(s): albuterol , aspirin ec, biotin, onetouch verio flex system, candesartan, carboxymethylcellulose, carvedilol, cholecalciferol, procysbi, fluconazole , fluoxetine, fluticasone, glucose blood, jardiance, onetouch delica plus lancet33g, loperamide, loratadine , lorazepam, metformin, multiple vitamins-minerals, onetouch verio, ozempic (0.25 or 0.5 mg/dose), pantoprazole, probiotic product, rosuvastatin, spironolactone, vitamin b-12, clobetasol  prop emollient base, and estradiol .   Allergies: Patient is allergic to morphine, ace inhibitors, and ezetimibe.   Social History: Patient  reports that she quit smoking about 13 years ago. Her smoking use included cigarettes. She  started smoking about 53 years ago. She has a 40 pack-year smoking history. She has never used smokeless tobacco. She reports current alcohol use. She reports that she does not use drugs.     OBJECTIVE     Physical Exam: Vitals:   01/21/24 1459  BP: 94/65  Pulse: 86   Gen: No apparent distress, A&O x 3.  Detailed Urogynecologic Evaluation:  Lichens noted at top of vagina and at the bottom of vagina. Urethra visualized. Vaginal tissues are dry and mildly friable on speculum exam. No noted internal vaginal bleeding. No noted discharge on exam.    ASSESSMENT AND PLAN    Jacqueline Alexander is a 73 y.o. with:  1. Incontinence of feces, unspecified fecal incontinence type   2. Vaginal irritation   3. Vaginal atrophy    Patient reports significant distress regarding her FI. She states she went on a cruise and was unable to enjoy the trip because she had to constantly change clothes or be close to a bathroom in case she had fecal leakage. We discussed SNM. She watched the video regarding the bowel incontinence. She may be interested in discussing this further. She does have a pacemaker so this may play a factor on her eligibility for SNM.  Patient reports she feels vaginal irritation and has not been using estrogen cream consistently weekly. We discussed using the estrogen cream x2 weekly.  Encouraged her to use the estrogen cream and clobetasol  PRN.   Patient to follow up to further discuss SNM with surgeon.    Trecia Maring G Zoua Caporaso, NP

## 2024-01-21 NOTE — Patient Instructions (Signed)
 Please use estrogen cream x2 weekly.  Use the clobetasol  cream for lichens sclerosus as needed.   Consider the option of the sacral nerve stimulator for your loss of bowel.

## 2024-02-22 DIAGNOSIS — E119 Type 2 diabetes mellitus without complications: Secondary | ICD-10-CM | POA: Diagnosis not present

## 2024-02-22 DIAGNOSIS — I110 Hypertensive heart disease with heart failure: Secondary | ICD-10-CM | POA: Diagnosis not present

## 2024-02-22 DIAGNOSIS — I428 Other cardiomyopathies: Secondary | ICD-10-CM | POA: Diagnosis not present

## 2024-02-22 DIAGNOSIS — I447 Left bundle-branch block, unspecified: Secondary | ICD-10-CM | POA: Diagnosis not present

## 2024-02-22 DIAGNOSIS — Z23 Encounter for immunization: Secondary | ICD-10-CM | POA: Diagnosis not present

## 2024-02-22 DIAGNOSIS — I5020 Unspecified systolic (congestive) heart failure: Secondary | ICD-10-CM | POA: Diagnosis not present

## 2024-02-22 DIAGNOSIS — Z87891 Personal history of nicotine dependence: Secondary | ICD-10-CM | POA: Diagnosis not present

## 2024-02-25 ENCOUNTER — Other Ambulatory Visit: Payer: Self-pay | Admitting: Family Medicine

## 2024-02-25 DIAGNOSIS — L989 Disorder of the skin and subcutaneous tissue, unspecified: Secondary | ICD-10-CM | POA: Diagnosis not present

## 2024-02-25 DIAGNOSIS — E538 Deficiency of other specified B group vitamins: Secondary | ICD-10-CM | POA: Diagnosis not present

## 2024-02-25 DIAGNOSIS — I10 Essential (primary) hypertension: Secondary | ICD-10-CM | POA: Diagnosis not present

## 2024-02-25 DIAGNOSIS — I5022 Chronic systolic (congestive) heart failure: Secondary | ICD-10-CM | POA: Diagnosis not present

## 2024-02-25 DIAGNOSIS — E1169 Type 2 diabetes mellitus with other specified complication: Secondary | ICD-10-CM | POA: Diagnosis not present

## 2024-02-25 DIAGNOSIS — L299 Pruritus, unspecified: Secondary | ICD-10-CM | POA: Diagnosis not present

## 2024-02-25 DIAGNOSIS — G2581 Restless legs syndrome: Secondary | ICD-10-CM | POA: Diagnosis not present

## 2024-02-25 DIAGNOSIS — Z23 Encounter for immunization: Secondary | ICD-10-CM | POA: Diagnosis not present

## 2024-02-25 DIAGNOSIS — E2839 Other primary ovarian failure: Secondary | ICD-10-CM

## 2024-02-25 DIAGNOSIS — M85851 Other specified disorders of bone density and structure, right thigh: Secondary | ICD-10-CM | POA: Diagnosis not present

## 2024-02-25 DIAGNOSIS — D509 Iron deficiency anemia, unspecified: Secondary | ICD-10-CM | POA: Diagnosis not present

## 2024-02-25 DIAGNOSIS — E7800 Pure hypercholesterolemia, unspecified: Secondary | ICD-10-CM | POA: Diagnosis not present

## 2024-02-25 DIAGNOSIS — I5189 Other ill-defined heart diseases: Secondary | ICD-10-CM | POA: Diagnosis not present

## 2024-03-04 DIAGNOSIS — L814 Other melanin hyperpigmentation: Secondary | ICD-10-CM | POA: Diagnosis not present

## 2024-03-04 DIAGNOSIS — L578 Other skin changes due to chronic exposure to nonionizing radiation: Secondary | ICD-10-CM | POA: Diagnosis not present

## 2024-03-04 DIAGNOSIS — C44629 Squamous cell carcinoma of skin of left upper limb, including shoulder: Secondary | ICD-10-CM | POA: Diagnosis not present

## 2024-03-27 ENCOUNTER — Other Ambulatory Visit: Payer: Self-pay | Admitting: Hematology and Oncology

## 2024-03-27 DIAGNOSIS — D500 Iron deficiency anemia secondary to blood loss (chronic): Secondary | ICD-10-CM

## 2024-03-27 NOTE — Assessment & Plan Note (Signed)
 Iron deficiency anemia diagnosed in May 2023 felt to be due to chronic GI losses associated with Barrett's esophagus.  She did not tolerate oral iron supplementation.  She receives IV iron as needed, most recently in May 2025.  She continues multivitamin with iron without difficulty.  She continues to follow with GI at [facility] and has regular EGD.  Her hemoglobin and iron studies are normal.  I will plan to see her back in 6 months with a CBC, CMP, iron/TIBC and ferritin.

## 2024-03-27 NOTE — Progress Notes (Signed)
 [facility]  [address] [place],  [place]  [address] [phone]  Clinic Day:  03/28/2024  Referring physician: Stephanie Charlene CROME, MD  ASSESSMENT & PLAN:   Assessment & Plan: Iron deficiency anemia Iron deficiency anemia diagnosed in May 2023 felt to be due to chronic GI losses associated with Barrett's esophagus.  She did not tolerate oral iron supplementation.  She receives IV iron as needed, most recently in May 2025.  She continues multivitamin with iron without difficulty.  She continues to follow with GI at [facility] and has regular EGD.  Her hemoglobin and iron studies are normal.  I will plan to see her back in 6 months with a CBC, CMP, iron/TIBC and ferritin.  Hypotension She is hypotensive today.  Her spironolactone was recently decreased in half.  She would like to hold it and contact Dr. Kerby office.  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  Her surgical site is slowly healing.  We will request the pathology.    The patient understands the plans discussed today and is in agreement with them.  She knows to contact our office if she develops concerns prior to her next appointment.   I provided 15 minutes of face-to-face time during this encounter and > 50% was spent counseling as documented under my assessment and plan.    Michiah Masse A Brit Carbonell, PA-C  [facility] [place] - A DEPT OF [facility]  [facility] [address] [place] [address] Dept: [phone] Dept Fax: [phone]   No orders of the defined types were placed in this encounter.     CHIEF COMPLAINT:  CC: Iron deficiency anemia  Current Treatment: IV iron as needed  HISTORY OF PRESENT ILLNESS:  Jacqueline Alexander is a 73 year old with iron deficiency anemia who I began seeing in June 2023 felt to be most likely due to GI blood loss.  The  patient had evidence of iron deficiency dating back to June 2022. She does not tolerate oral iron due to constipation, so was referred to us  for IV iron.  She received IV Feraheme  in June 2023 with normalization of her hemoglobin and iron stores. She was taking oral B12 and her B12 level was elevated in November 2023, so we had her discontinue B12 supplement.   She has a history of Barrett's esophagus and her last EGD was in March 2021 at [facility] in [place].  This revealed persistent Barrett's disease. She continues pantoprazole 20 mg daily.  Her last colonoscopy was in January 2018 at [facility].  This was normal, so follow-up in 10 years was recommended. The patient has never had gastric surgery.  She underwent total hysterectomy and bilateral salpingo-oophorectomy with bladder tacking at age 83 for bladder and uterine prolapse and denies vaginal bleeding. She is on aspirin 81 mg daily.  She denied heavy alcohol use, but does drink socially.     She was referred to [facility] for follow-up of her Barrett's disease.  EGD in January 2024 revealed more advanced changes of Barrett's, so endoscopic submucosal dissection was scheduled.  This procedure was not done due to findings of a left bundle branch block preop EKG.  She saw her cardiologist and had an echocardiogram. She underwent endoscopic submucosal resection in May 2024.  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  She had RFA for her Barrett's disease in August 2024.  She continues to follow with GI at [facility] and underwent EGD in December 2024 and March 2025.  She had recurrent iron deficiency in May 2025 and received IV Feraheme  again at that time.    INTERVAL HISTORY:   Jacqueline Alexander is here today for repeat clinical assessment.  She denies progressive fatigue concerning for recurrent anemia.  She denies any overt form of blood loss.  She denies fevers or  chills. She reports pain down her right leg into her ankle which she attributes to sciatica.  She states she has intermittent constipation.  She reports intermittent lightheadedness.  She states her blood pressure has been lower, so spironolactone was reduced in half.  She has started Ozempic for diabetes.  Her appetite is fairly good. Her weight has decreased 3 pounds over last 6 months.  She had repeat EGD in August 2025 was improved to irregularity at the Z-line consistent with prior treatment.  Biopsy revealed squamocolumnar junction with reactive changes. Negative for goblet cell intestinal metaplasia.  Follow-up EGD in 3 months was recommended.  She states she is scheduled to see GI again and December.  She states she had a squamous cell carcinoma of the skin removed from left arm 2 weeks ago.   REVIEW OF SYSTEMS:   Review of Systems  Constitutional:  Positive for fatigue. Negative for appetite change, chills, fever and unexpected weight change.  HENT:   Negative for lump/mass, mouth sores, nosebleeds and sore throat.   Respiratory:  Negative for cough, hemoptysis and shortness of breath.   Cardiovascular:  Negative for chest pain and leg swelling.  Gastrointestinal:  Positive for constipation. Negative for abdominal pain, blood in stool, diarrhea, nausea and vomiting.  Endocrine: Negative for hot flashes.  Genitourinary:  Negative for difficulty urinating, dysuria, frequency, hematuria and vaginal bleeding.   Musculoskeletal:  Positive for arthralgias (right leg/ankle). Negative for back pain, gait problem, myalgias and neck pain.  Skin:  Negative for rash.  Neurological:  Positive for light-headedness (intermittent) and numbness (right leg/ankle). Negative for dizziness, extremity weakness, gait problem and headaches.  Hematological:  Negative for adenopathy. Does not bruise/bleed easily.  Psychiatric/Behavioral:  Negative for depression and sleep disturbance. The patient is not  nervous/anxious.      VITALS:   Blood pressure (!) 80/51, pulse 85, temperature 98 F (36.7 C), temperature source Oral, resp. rate 20, height 5' 3.7 (1.618 m), weight 150 lb 4.8 oz (68.2 kg), SpO2 96%.  Wt Readings from Last 3 Encounters:  03/28/24 150 lb 4.8 oz (68.2 kg)  10/03/23 153 lb 6.4 oz (69.6 kg)  04/05/23 153 lb 3.2 oz (69.5 kg)    Body mass index is 26.04 kg/m.  Performance status (ECOG): 1 - Symptomatic but completely ambulatory    PHYSICAL EXAM:   Physical Exam Vitals and nursing note reviewed.  Constitutional:      General: She is not in acute distress.    Appearance: Normal appearance.  HENT:     Head: Normocephalic and atraumatic.     Mouth/Throat:     Mouth: Mucous membranes are moist.     Pharynx: Oropharynx is clear. No oropharyngeal exudate or posterior oropharyngeal erythema.  Eyes:     General: No scleral icterus.    Extraocular Movements: Extraocular movements intact.     Conjunctiva/sclera: Conjunctivae normal.     Pupils: Pupils are equal, round, and reactive to light.  Cardiovascular:  Rate and Rhythm: Normal rate and regular rhythm.     Heart sounds: Normal heart sounds. No murmur heard.    No friction rub. No gallop.  Pulmonary:     Effort: Pulmonary effort is normal.     Breath sounds: Normal breath sounds. No wheezing, rhonchi or rales.  Abdominal:     General: There is no distension.     Palpations: Abdomen is soft. There is no hepatomegaly, splenomegaly or mass.     Tenderness: There is no abdominal tenderness.  Musculoskeletal:        General: Normal range of motion.     Cervical back: Normal range of motion and neck supple. No tenderness.     Right lower leg: No edema.     Left lower leg: No edema.  Lymphadenopathy:     Cervical: No cervical adenopathy.     Upper Body:     Right upper body: No supraclavicular or axillary adenopathy.     Left upper body: No supraclavicular or axillary adenopathy.     Lower Body: No right  inguinal adenopathy. No left inguinal adenopathy.  Skin:    General: Skin is warm and dry.     Coloration: Skin is not jaundiced.     Findings: No rash.  Neurological:     Mental Status: She is alert and oriented to person, place, and time.     Cranial Nerves: No cranial nerve deficit.  Psychiatric:        Mood and Affect: Mood normal.        Behavior: Behavior normal.        Thought Content: Thought content normal.     LABS:      Latest Ref Rng & Units 03/28/2024   10:41 AM 10/03/2023   10:50 AM 04/05/2023   10:43 AM  CBC  WBC 4.0 - 10.5 K/uL 10.6  9.5  10.5   Hemoglobin 12.0 - 15.0 g/dL 86.0  86.3  86.3   Hematocrit 36.0 - 46.0 % 41.9  41.9  41.1   Platelets 150 - 400 K/uL 206  271  289       Latest Ref Rng & Units 03/28/2024   10:41 AM 10/03/2023   10:50 AM 04/05/2023   10:43 AM  CMP  Glucose 70 - 99 mg/dL 817  841  843   BUN 8 - 23 mg/dL 18  22  20    Creatinine 0.44 - 1.00 mg/dL 8.94  9.00  9.09   Sodium 135 - 145 mmol/L 137  140  138   Potassium 3.5 - 5.1 mmol/L 4.9  4.3  4.6   Chloride 98 - 111 mmol/L 102  104  103   CO2 22 - 32 mmol/L 25  22  23    Calcium 8.9 - 10.3 mg/dL 89.6  89.5  89.5   Total Protein 6.5 - 8.1 g/dL 7.3  7.0  7.1   Total Bilirubin 0.0 - 1.2 mg/dL 0.7  0.3  0.3   Alkaline Phos 38 - 126 U/L 95  80  67   AST 15 - 41 U/L 26  13  15    ALT 0 - 44 U/L 19  8  8      Latest Reference Range & Units 03/28/24 10:41  Neutrophils % 72  Lymphocytes % 20  Monocytes Relative % 4  Eosinophil % 3  Basophil % 1  Immature Granulocytes % 0  NEUT# 1.7 - 7.7 K/uL 7.7  Lymphs Abs 0.7 - 4.0 K/uL 2.1  Monocyte # 0.1 - 1.0 K/uL 0.4  Eosinophils Absolute 0.0 - 0.5 K/uL 0.3  Basophils Absolute 0.0 - 0.1 K/uL 0.1  Abs Immature Granulocytes 0.00 - 0.07 K/uL 0.00  Band Neutrophils % 0  Metamyelocytes Relative % 0  Myelocytes % 0  Promyelocytes Relative % 0  Blasts % 0  RBC Morphology  MORPHOLOGY UNREMARKABLE  WBC Morphology  MORPHOLOGY UNREMARKABLE  Smear  Review  PLATELET CLUMPS NOTED ON SMEAR    Lab Results  Component Value Date   TIBC 364 03/28/2024   TIBC 456 (H) 10/03/2023   TIBC 428 04/05/2023   FERRITIN 367 (H) 03/28/2024   FERRITIN 10 (L) 10/03/2023   FERRITIN 15 04/05/2023   IRONPCTSAT 29 03/28/2024   IRONPCTSAT 11 10/03/2023   IRONPCTSAT 16 04/05/2023   No results found for: LDH  STUDIES:   No results found.    HISTORY:   Past Medical History:  Diagnosis Date   Anxiety    Arthritis    Barrett esophagus    Chronic diarrhea    Colon polyps    Depression    Diabetes mellitus without complication (HCC)    Family history of ovarian cancer 12/11/2021   Family history of stomach cancer 12/11/2021   GERD (gastroesophageal reflux disease)    Hyperlipemia    Hypertension    Insomnia    Lumbar radiculitis    Mitral insufficiency    Restless leg    Restless leg syndrome    Sensory urge incontinence     Past Surgical History:  Procedure Laterality Date   CARPAL TUNNEL RELEASE Left    CHOLECYSTECTOMY     COLONOSCOPY N/A 11/10/2021   Procedure: COLONOSCOPY;  Surgeon: Onita Elspeth Sharper, DO;  Location: [facility] ENDOSCOPY;  Service: Gastroenterology;  Laterality: N/A;   defibulator     August 2024 at [facility]   esophageal ablation  12/15/2022   for Barretts disease   ESOPHAGOGASTRODUODENOSCOPY N/A 11/10/2021   Procedure: ESOPHAGOGASTRODUODENOSCOPY (EGD);  Surgeon: Onita Elspeth Sharper, DO;  Location: [facility] ENDOSCOPY;  Service: Gastroenterology;  Laterality: N/A;  DM   ESOPHAGOGASTRODUODENOSCOPY (EGD) WITH PROPOFOL  N/A 08/06/2019   Procedure: ESOPHAGOGASTRODUODENOSCOPY (EGD) WITH PROPOFOL ;  Surgeon: Toledo, Ladell POUR, MD;  Location: [facility] ENDOSCOPY;  Service: Gastroenterology;  Laterality: N/A;   HERNIA REPAIR     INCONTINENCE SURGERY     sling   OOPHORECTOMY     TONSILLECTOMY     VAGINAL HYSTERECTOMY     VAGINAL PROLAPSE REPAIR      Family History  Problem Relation Age of Onset   Hypertension Mother     Arthritis Mother    Heart disease Mother    Skin cancer Mother        dx after 81   Hypertension Father    Tuberculosis Father    Emphysema Father    Heart attack Father    Hyperlipidemia Father    Hypertension Sister    Skin cancer Sister        dx after 34   Ovarian cancer Paternal Grandmother        d. late 53s   Stomach cancer Maternal Uncle        dx late 3s   Lung cancer Paternal Aunt    Ovarian cancer Paternal Aunt        dx after 79   Pancreatitis Niece        d. 6   Breast cancer Neg Hx    Bladder Cancer Neg Hx    Uterine cancer Neg Hx  Renal cancer Neg Hx     Social History:  reports that she quit smoking about 13 years ago. Her smoking use included cigarettes. She started smoking about 53 years ago. She has a 40 pack-year smoking history. She has never used smokeless tobacco. She reports current alcohol use. She reports that she does not use drugs.The patient is alone today.  Allergies:  Allergies  Allergen Reactions   Morphine Nausea And Vomiting, Nausea Only and Other (See Comments)   Ace Inhibitors Other (See Comments)    Patient thinks she was light headed . She is not sure of the reaction   Ezetimibe Other (See Comments)    Leg pain    Current Medications: Current Outpatient Medications  Medication Sig Dispense Refill   albuterol  (VENTOLIN  HFA) 108 (90 Base) MCG/ACT inhaler SMARTSIG:2 Puff(s) By Mouth Every 4-6 Hours PRN     aspirin EC 81 MG tablet Take 81 mg by mouth daily. Swallow whole.     BIOTIN PO Take 1 tablet by mouth daily.     Blood Glucose Monitoring Suppl (ONETOUCH VERIO FLEX SYSTEM) w/Device KIT See admin instructions.     candesartan (ATACAND) 4 MG tablet Take by mouth.     carboxymethylcellulose (REFRESH PLUS) 0.5 % SOLN Place 1-2 drops into both eyes daily as needed.     carvedilol (COREG) 25 MG tablet Take by mouth.     cholecalciferol (VITAMIN D3) 10 MCG (400 UNIT) TABS tablet Take 1,000 Units by mouth.     Clobetasol  Prop  Emollient Base 0.05 % emollient cream Apply 1 Application topically as needed. 30 g 5   estradiol  (ESTRACE ) 0.1 MG/GM vaginal cream Place 0.5g nightly twice a week 42.5 g 11   FLUoxetine (PROZAC) 20 MG capsule Take 20 mg by mouth daily. 3 tablets for total dose of 60 mg a day     fluticasone (FLONASE) 50 MCG/ACT nasal spray Place 1 spray into both nostrils daily.     glucose blood test strip TEST SUGAR THREE TIMES DAILY     JARDIANCE 10 MG TABS tablet Take 10 mg by mouth daily.     Lancets (ONETOUCH DELICA PLUS LANCET33G) MISC Apply topically.     loperamide (IMODIUM A-D) 2 MG tablet Take 2 mg by mouth as needed.     loratadine  (CLARITIN ) 10 MG tablet Take 10 mg by mouth daily.     LORazepam (ATIVAN) 0.5 MG tablet Take 0.5 mg by mouth as needed.  0   metFORMIN (GLUCOPHAGE-XR) 750 MG 24 hr tablet Take 750 mg by mouth 2 (two) times daily.     Multiple Vitamins-Minerals (MULTIPLE VITAMINS/WOMENS PO) Take 1 tablet by mouth daily.     ONETOUCH VERIO test strip daily.     OZEMPIC, 0.25 OR 0.5 MG/DOSE, 2 MG/3ML SOPN Inject 0.5 Doses as directed once a week.     pantoprazole (PROTONIX) 40 MG tablet 40 mg 2 (two) times daily.     Probiotic Product (PROBIOTIC BLEND PO) Take 1 tablet by mouth daily.     rosuvastatin (CRESTOR) 5 MG tablet Take 5 mg by mouth daily.     spironolactone (ALDACTONE) 25 MG tablet Take 1 tablet by mouth daily.     vitamin B-12 (CYANOCOBALAMIN ) 250 MCG tablet Take 2,500 mcg by mouth daily.     No current facility-administered medications for this visit.

## 2024-03-28 ENCOUNTER — Telehealth: Payer: Self-pay

## 2024-03-28 ENCOUNTER — Other Ambulatory Visit: Payer: Self-pay

## 2024-03-28 ENCOUNTER — Encounter: Payer: Self-pay | Admitting: Hematology and Oncology

## 2024-03-28 ENCOUNTER — Inpatient Hospital Stay: Attending: Hematology and Oncology | Admitting: Hematology and Oncology

## 2024-03-28 ENCOUNTER — Inpatient Hospital Stay

## 2024-03-28 ENCOUNTER — Telehealth: Payer: Self-pay | Admitting: Hematology and Oncology

## 2024-03-28 VITALS — BP 80/51 | HR 85 | Temp 98.0°F | Resp 20 | Ht 63.7 in | Wt 150.3 lb

## 2024-03-28 DIAGNOSIS — C44629 Squamous cell carcinoma of skin of left upper limb, including shoulder: Secondary | ICD-10-CM | POA: Insufficient documentation

## 2024-03-28 DIAGNOSIS — D500 Iron deficiency anemia secondary to blood loss (chronic): Secondary | ICD-10-CM

## 2024-03-28 DIAGNOSIS — D509 Iron deficiency anemia, unspecified: Secondary | ICD-10-CM

## 2024-03-28 DIAGNOSIS — I959 Hypotension, unspecified: Secondary | ICD-10-CM | POA: Insufficient documentation

## 2024-03-28 LAB — CBC WITH DIFFERENTIAL (CANCER CENTER ONLY)
Abs Immature Granulocytes: 0 K/uL (ref 0.00–0.07)
Band Neutrophils: 0 %
Basophils Absolute: 0.1 K/uL (ref 0.0–0.1)
Basophils Relative: 1 %
Blasts: 0 %
Eosinophils Absolute: 0.3 K/uL (ref 0.0–0.5)
Eosinophils Relative: 3 %
HCT: 41.9 % (ref 36.0–46.0)
Hemoglobin: 13.9 g/dL (ref 12.0–15.0)
Immature Granulocytes: 0 %
Lymphocytes Relative: 20 %
Lymphs Abs: 2.1 K/uL (ref 0.7–4.0)
MCH: 30.8 pg (ref 26.0–34.0)
MCHC: 33.2 g/dL (ref 30.0–36.0)
MCV: 92.7 fL (ref 80.0–100.0)
Metamyelocytes Relative: 0 %
Monocytes Absolute: 0.4 K/uL (ref 0.1–1.0)
Monocytes Relative: 4 %
Myelocytes: 0 %
Neutro Abs: 7.7 K/uL (ref 1.7–7.7)
Neutrophils Relative %: 72 %
Other: 0 %
Platelet Count: 206 K/uL (ref 150–400)
Promyelocytes Relative: 0 %
RBC: 4.52 MIL/uL (ref 3.87–5.11)
RDW: 12 % (ref 11.5–15.5)
WBC Count: 10.6 K/uL — ABNORMAL HIGH (ref 4.0–10.5)
nRBC: 0 % (ref 0.0–0.2)
nRBC: 0 /100{WBCs}

## 2024-03-28 LAB — VITAMIN B12: Vitamin B-12: 1742 pg/mL — ABNORMAL HIGH (ref 180–914)

## 2024-03-28 LAB — CMP (CANCER CENTER ONLY)
ALT: 19 U/L (ref 0–44)
AST: 26 U/L (ref 15–41)
Albumin: 4.3 g/dL (ref 3.5–5.0)
Alkaline Phosphatase: 95 U/L (ref 38–126)
Anion gap: 10 (ref 5–15)
BUN: 18 mg/dL (ref 8–23)
CO2: 25 mmol/L (ref 22–32)
Calcium: 10.3 mg/dL (ref 8.9–10.3)
Chloride: 102 mmol/L (ref 98–111)
Creatinine: 1.05 mg/dL — ABNORMAL HIGH (ref 0.44–1.00)
GFR, Estimated: 57 mL/min — ABNORMAL LOW (ref >60)
Glucose, Bld: 182 mg/dL — ABNORMAL HIGH (ref 70–99)
Potassium: 4.9 mmol/L (ref 3.5–5.1)
Sodium: 137 mmol/L (ref 135–145)
Total Bilirubin: 0.7 mg/dL (ref 0.0–1.2)
Total Protein: 7.3 g/dL (ref 6.5–8.1)

## 2024-03-28 LAB — FOLATE: Folate: >20 ng/mL (ref >5.9)

## 2024-03-28 LAB — IRON AND TIBC
Iron: 106 ug/dL (ref 28–170)
Saturation Ratios: 29 % (ref 10.4–31.8)
TIBC: 364 ug/dL (ref 250–450)
UIBC: 258 ug/dL

## 2024-03-28 LAB — FERRITIN: Ferritin: 367 ng/mL — ABNORMAL HIGH (ref 11–307)

## 2024-03-28 NOTE — Telephone Encounter (Signed)
 Patient has been scheduled for follow-up visit per 03/28/24 LOS.  Pt given an appt calendar with date and time.

## 2024-03-28 NOTE — Assessment & Plan Note (Signed)
 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  Her surgical site is slowly healing.  We will request the pathology.

## 2024-03-28 NOTE — Assessment & Plan Note (Signed)
 She is hypotensive today.  Her spironolactone was recently decreased in half.  She would like to hold it and contact Dr. Kerby office.

## 2024-03-28 NOTE — Telephone Encounter (Signed)
 Labs faxed

## 2024-03-28 NOTE — Telephone Encounter (Signed)
-----   Message from Andrez DELENA Foy sent at 03/28/2024 11:33 AM EST ----- Please fax labs to Dr. Stephanie RMA Jackline. Thanks

## 2024-03-31 ENCOUNTER — Telehealth: Payer: Self-pay

## 2024-03-31 NOTE — Telephone Encounter (Signed)
 Mosher, [facility]rez LABOR, PA-C Edmonds, Krista L, LPN; Martell Greig ORN, RN Left msg for patient to call regarding labs. Her iron levels are good. B12 is elevated, so if she is taking B12, can hold for now. Thanks

## 2024-03-31 NOTE — Addendum Note (Signed)
 Addended byBETHA MARTELL GREIG LELON on: 03/31/2024 10:59 AM   Modules accepted: Orders

## 2024-03-31 NOTE — Telephone Encounter (Addendum)
 Pt returning call to [employer]. I told her [facility] is out of clinic today. I told her I would send the message to her, so she will see it on Tuesday. Pt verbalized understanding.

## 2024-04-01 ENCOUNTER — Telehealth: Payer: Self-pay

## 2024-04-01 NOTE — Telephone Encounter (Signed)
 See previous message , taken care of.

## 2024-04-01 NOTE — Telephone Encounter (Signed)
 They will fax report, Attn Kelli

## 2024-04-01 NOTE — Telephone Encounter (Signed)
-----   Message from Jacqueline Alexander sent at 03/28/2024  5:00 PM EST ----- Left msg for patient to call regarding labs. Her iron levels are good. B12 is elevated, so if she is taking B12, can hold for now. Thanks

## 2024-04-01 NOTE — Telephone Encounter (Signed)
-----   Message from [facility]rez DELENA Foy sent at 03/28/2024  4:57 PM EST ----- Please request pathology from skin cancer removal left forearm from [facility]. Thanks

## 2024-04-16 ENCOUNTER — Ambulatory Visit
Admission: RE | Admit: 2024-04-16 | Discharge: 2024-04-16 | Disposition: A | Source: Ambulatory Visit | Attending: Family Medicine | Admitting: Family Medicine

## 2024-04-16 DIAGNOSIS — Z1231 Encounter for screening mammogram for malignant neoplasm of breast: Secondary | ICD-10-CM | POA: Diagnosis present

## 2024-04-16 DIAGNOSIS — E2839 Other primary ovarian failure: Secondary | ICD-10-CM

## 2024-04-21 ENCOUNTER — Ambulatory Visit: Admitting: Obstetrics and Gynecology

## 2024-09-25 ENCOUNTER — Inpatient Hospital Stay: Admitting: Hematology and Oncology

## 2024-09-25 ENCOUNTER — Inpatient Hospital Stay
